# Patient Record
Sex: Male | Born: 1965 | Race: Black or African American | Hispanic: No | Marital: Married | Smoking: Never smoker
Health system: Southern US, Community
[De-identification: ages and names within clinical notes are randomized; demographics above are authoritative.]

## PROBLEM LIST (undated history)

## (undated) DIAGNOSIS — I10 Essential (primary) hypertension: Secondary | ICD-10-CM

## (undated) DIAGNOSIS — E785 Hyperlipidemia, unspecified: Secondary | ICD-10-CM

## (undated) DIAGNOSIS — N200 Calculus of kidney: Secondary | ICD-10-CM

## (undated) HISTORY — DX: Essential (primary) hypertension: I10

## (undated) HISTORY — PX: LITHOTRIPSY: SUR834

## (undated) HISTORY — DX: Hyperlipidemia, unspecified: E78.5

## (undated) HISTORY — DX: Calculus of kidney: N20.0

---

## 2003-07-08 ENCOUNTER — Ambulatory Visit (HOSPITAL_BASED_OUTPATIENT_CLINIC_OR_DEPARTMENT_OTHER): Admission: RE | Admit: 2003-07-08 | Discharge: 2003-07-08 | Payer: Self-pay

## 2008-05-03 ENCOUNTER — Emergency Department (HOSPITAL_COMMUNITY): Admission: EM | Admit: 2008-05-03 | Discharge: 2008-05-03 | Payer: Self-pay | Admitting: Emergency Medicine

## 2009-08-11 ENCOUNTER — Ambulatory Visit (HOSPITAL_COMMUNITY): Admission: RE | Admit: 2009-08-11 | Discharge: 2009-08-11 | Payer: Self-pay | Admitting: Family Medicine

## 2009-08-19 ENCOUNTER — Emergency Department (HOSPITAL_COMMUNITY): Admission: EM | Admit: 2009-08-19 | Discharge: 2009-08-20 | Payer: Self-pay | Admitting: Emergency Medicine

## 2010-04-21 LAB — URINALYSIS, ROUTINE W REFLEX MICROSCOPIC
Hgb urine dipstick: NEGATIVE
Protein, ur: NEGATIVE mg/dL
Specific Gravity, Urine: 1.025 (ref 1.005–1.030)
Urobilinogen, UA: 0.2 mg/dL (ref 0.0–1.0)

## 2010-04-21 LAB — DIFFERENTIAL
Eosinophils Absolute: 0.2 10*3/uL (ref 0.0–0.7)
Eosinophils Relative: 3 % (ref 0–5)
Lymphocytes Relative: 39 % (ref 12–46)
Lymphs Abs: 2.9 10*3/uL (ref 0.7–4.0)
Monocytes Absolute: 0.6 10*3/uL (ref 0.1–1.0)
Monocytes Relative: 8 % (ref 3–12)
Neutrophils Relative %: 50 % (ref 43–77)

## 2010-04-21 LAB — CBC
MCHC: 33.8 g/dL (ref 30.0–36.0)
MCV: 91.1 fL (ref 78.0–100.0)
Platelets: 284 10*3/uL (ref 150–400)

## 2010-04-21 LAB — COMPREHENSIVE METABOLIC PANEL
ALT: 27 U/L (ref 0–53)
AST: 20 U/L (ref 0–37)
Glucose, Bld: 116 mg/dL — ABNORMAL HIGH (ref 70–99)
Potassium: 3.7 mEq/L (ref 3.5–5.1)
Sodium: 137 mEq/L (ref 135–145)
Total Bilirubin: 0.5 mg/dL (ref 0.3–1.2)

## 2010-05-17 LAB — COMPREHENSIVE METABOLIC PANEL
ALT: 28 U/L (ref 0–53)
AST: 22 U/L (ref 0–37)
BUN: 16 mg/dL (ref 6–23)
CO2: 26 mEq/L (ref 19–32)
Creatinine, Ser: 1.31 mg/dL (ref 0.4–1.5)
GFR calc Af Amer: >60 mL/min (ref >60)
Glucose, Bld: 115 mg/dL — ABNORMAL HIGH (ref 70–99)
Potassium: 4.1 mEq/L (ref 3.5–5.1)

## 2010-05-17 LAB — CBC
HCT: 40 % (ref 39.0–52.0)
Hemoglobin: 13.7 g/dL (ref 13.0–17.0)
MCHC: 34.2 g/dL (ref 30.0–36.0)
MCV: 91.6 fL (ref 78.0–100.0)
Platelets: 313 10*3/uL (ref 150–400)
RBC: 4.37 MIL/uL (ref 4.22–5.81)
WBC: 7.7 10*3/uL (ref 4.0–10.5)

## 2010-05-17 LAB — DIFFERENTIAL
Eosinophils Absolute: 0 10*3/uL (ref 0.0–0.7)
Eosinophils Relative: 0 % (ref 0–5)
Lymphocytes Relative: 21 % (ref 12–46)
Lymphs Abs: 1.6 10*3/uL (ref 0.7–4.0)

## 2010-05-17 LAB — URINALYSIS, ROUTINE W REFLEX MICROSCOPIC
Leukocytes, UA: NEGATIVE
Nitrite: NEGATIVE
Urobilinogen, UA: 0.2 mg/dL (ref 0.0–1.0)

## 2010-06-22 NOTE — Op Note (Signed)
NAME:  Dave Barker, Dave Barker                    ACCOUNT NO.:  [id number]   MEDICAL RECORD NO.:  [id number]                   PATIENT TYPE:  AMB   LOCATION:  DSC                                  FACILITY:  [facility]   PHYSICIAN:  Pasty Spillers. Maple Hudson, M.D.              DATE OF BIRTH:  May 31, 1965   DATE OF PROCEDURE:  07/08/2003  DATE OF DISCHARGE:                                 OPERATIVE REPORT   PREOPERATIVE DIAGNOSIS:  Exotropia.   POSTOPERATIVE DIAGNOSIS:  Exotropia.   PROCEDURE:  1. Lateral rectus muscle recession, 8.0 mm left eye.  2. Lateral rectus muscle recession, 9.0 mm right eye.  3. Medial rectus muscle resection, 7.5 mm right eye.   SURGEON:  Pasty Spillers. Maple Hudson, M.D.   ANESTHESIA:  General (laryngeal mask).   COMPLICATIONS:  None.   DESCRIPTION OF PROCEDURE:  After routine preop evaluation including informed  consent, the patient was taken to the operating room where he was identified  by myself.  General anesthesia was induced without difficulty after  placement of appropriate monitors.  The patient was prepped and draped in  standard, sterile fashion. A lid speculum was placed in the left eye.   Through an inferotemporal fornix incision through conjunctiva and Tenon's  fascia, the left medial rectus muscle was engaged on a series of muscle  hooks and carefully cleared of its fascial attachments.  The tendon was  secured with a double-arm 6-0 Vicryl suture, with a double locking bite at  each border of the muscle, 1 mm from the insertion.  Muscle was disinserted,  and was reattached to sclera at a measured distance of 8.0 mm posterior to  the original insertion, using direct and scleral passes in crossed-swords  fashion. The suture ends were tied securely. After the position of the  muscle had been checked and found to be accurate, conjunctiva was closed  with 2 interrupted 6-0 plain gut sutures.   The lid speculum was transferred to the right eye where the lateral  rectus  muscle was recessed 9.0 mm, using the same technique described for the left  lateral rectus muscle recession.  Through an inferonasal fornix incision,  the left medial rectus muscle was retracted on a series of muscle hooks and  carefully cleared of its fascial attachments.  The muscle was spread between  2 self-retaining hooks.  A 2 mm bite was taken of the center of the muscle  belly with  double-arm 6-0 Vicryl suture, and a knot was tied securely at  this location.  The needle at each arm of the double arm suture was passed  from the center of the muscle belly to the periphery, 7.5 mm posterior to  and parallel to the insertion.  A double locking bite was placed at each  border of the muscle.  A resection clamp was placed on the muscle just  anterior to these sutures.  The muscle was disinserted, and each pole suture  was passed posteriorly to anteriorly through the periphery of the stump,  then anteriorly to posteriorly near the center of the stump, then  posteriorly to anteriorly through the center of the muscle belly, just  posterior to the previously placed knots.  The muscle was drawn up to the  level of the original insertion and all slack was removed before the suture  ends were tied  securely.  The conjunctiva was closed with 2 interrupted 6-0 plain gut  sutures. TobraDex ointment was placed in each eye. The patient was awakened  without difficulty and taken to the recovery room in stable condition,  having suffered no intraoperative or immediate postoperative complications.                                               Pasty Spillers. Maple Hudson, M.D.    Cheron Schaumann  D:  07/08/2003  T:  07/08/2003  Job:  [id number]

## 2012-08-26 ENCOUNTER — Encounter (HOSPITAL_COMMUNITY): Payer: Self-pay

## 2012-08-26 ENCOUNTER — Emergency Department (HOSPITAL_COMMUNITY)
Admission: EM | Admit: 2012-08-26 | Discharge: 2012-08-26 | Disposition: A | Discharge status: Home / Self Care | Payer: BC Managed Care – PPO | Attending: Emergency Medicine | Admitting: Emergency Medicine

## 2012-08-26 DIAGNOSIS — M79609 Pain in unspecified limb: Secondary | ICD-10-CM | POA: Insufficient documentation

## 2012-08-26 DIAGNOSIS — M79602 Pain in left arm: Secondary | ICD-10-CM

## 2012-08-26 MED ORDER — IBUPROFEN 800 MG PO TABS
800.0000 mg | ORAL_TABLET | Freq: Once | ORAL | Status: AC
  Administered 2012-08-26: 800 mg via ORAL
  Filled 2012-08-26: qty 1

## 2012-08-26 NOTE — ED Provider Notes (Signed)
   History    CSN: [id number] Arrival date & time 08/26/12  0215  First MD Initiated Contact with Patient 08/26/12 0253     Chief Complaint  Patient presents with  . Arm Pain   (Consider location/radiation/quality/duration/timing/severity/associated sxs/prior Treatment) HPI HPI Comments: Dave Barker is a 47 y.o. male who presents to the Emergency Department complaining of left arm pain since he was moving old tiles in a garage. He thought he might have been bitten by a spider. His arm hurts from the elbow to the wrist along the medial side.  He has taken no medicines.  PCP Dr. Phillips Odor History reviewed. No pertinent past medical history. History reviewed. No pertinent past surgical history. No family history on file. History  Substance Use Topics  . Smoking status: Never Smoker   . Smokeless tobacco: Not on file  . Alcohol Use: No    Review of Systems  Constitutional: Negative for fever.       10 Systems reviewed and are negative for acute change except as noted in the HPI.  HENT: Negative for congestion.   Eyes: Negative for discharge and redness.  Respiratory: Negative for cough and shortness of breath.   Cardiovascular: Negative for chest pain.  Gastrointestinal: Negative for vomiting and abdominal pain.  Musculoskeletal: Negative for back pain.       Left arm pain  Skin: Negative for rash.  Neurological: Negative for syncope, numbness and headaches.  Psychiatric/Behavioral:       No behavior change.    Allergies  Review of patient's allergies indicates no known allergies.  Home Medications  No current outpatient prescriptions on file. BP 141/81  Pulse 87  Temp(Src) 98.2 F (36.8 C) (Oral)  Resp 20  Ht 6' (1.829 m)  Wt 228 lb (103.42 kg)  BMI 30.92 kg/m2  SpO2 98% Physical Exam  Nursing note and vitals reviewed. Constitutional: He appears well-developed and well-nourished.  Awake, alert, nontoxic appearance.  HENT:  Head: Normocephalic and  atraumatic.  Eyes: EOM are normal. Pupils are equal, round, and reactive to light.  Neck: Normal range of motion. Neck supple.  Cardiovascular: Normal rate and intact distal pulses.   Pulmonary/Chest: Effort normal and breath sounds normal. He exhibits no tenderness.  Abdominal: Soft. There is no tenderness. There is no rebound.  Musculoskeletal: He exhibits no tenderness.  Baseline ROM, no obvious new focal weakness.Left arm without erythema, lesions, bruising. MiId tenderness to palpation over the medial side of his left arm.   Neurological:  Mental status and motor strength appears baseline for patient and situation.  Skin: No rash noted.  Psychiatric: He has a normal mood and affect.    ED Course  Procedures (including critical care time)  1. Arm pain, anterior, left     MDM  Patient with left arm pain, no lesions, bruising, erythema. Given ibuprofen and told to watch the arm for lesions and signs of infection. Pt stable in ED with no significant deterioration in condition.The patient appears reasonably screened and/or stabilized for discharge and I doubt any other medical condition or other [facility] requiring further screening, evaluation, or treatment in the ED at this time prior to discharge.  MDM Reviewed: nursing note and vitals     Nicoletta Dress. Colon Branch, MD 08/26/12 [phone]

## 2014-01-24 ENCOUNTER — Encounter (HOSPITAL_COMMUNITY): Payer: Self-pay

## 2014-01-24 ENCOUNTER — Ambulatory Visit (HOSPITAL_COMMUNITY)
Admission: RE | Admit: 2014-01-24 | Discharge: 2014-01-24 | Disposition: A | Discharge status: Home / Self Care | Payer: BC Managed Care – PPO | Source: Ambulatory Visit | Attending: Family Medicine | Admitting: Family Medicine

## 2014-01-24 ENCOUNTER — Other Ambulatory Visit (HOSPITAL_COMMUNITY): Payer: Self-pay | Admitting: Family Medicine

## 2014-01-24 DIAGNOSIS — R300 Dysuria: Secondary | ICD-10-CM | POA: Insufficient documentation

## 2014-01-24 DIAGNOSIS — K429 Umbilical hernia without obstruction or gangrene: Secondary | ICD-10-CM | POA: Insufficient documentation

## 2014-01-24 DIAGNOSIS — R310 Gross hematuria: Secondary | ICD-10-CM | POA: Insufficient documentation

## 2014-01-24 DIAGNOSIS — N200 Calculus of kidney: Secondary | ICD-10-CM | POA: Insufficient documentation

## 2014-01-24 MED ORDER — IOHEXOL 300 MG/ML  SOLN
100.0000 mL | Freq: Once | INTRAMUSCULAR | Status: AC | PRN
  Administered 2014-01-24: 100 mL via INTRAVENOUS

## 2018-11-17 ENCOUNTER — Emergency Department (HOSPITAL_COMMUNITY)
Admission: EM | Admit: 2018-11-17 | Discharge: 2018-11-17 | Disposition: A | Discharge status: Home / Self Care | Payer: BC Managed Care – PPO | Attending: Emergency Medicine | Admitting: Emergency Medicine

## 2018-11-17 ENCOUNTER — Other Ambulatory Visit: Payer: Self-pay

## 2018-11-17 DIAGNOSIS — R109 Unspecified abdominal pain: Secondary | ICD-10-CM | POA: Insufficient documentation

## 2018-11-17 DIAGNOSIS — R319 Hematuria, unspecified: Secondary | ICD-10-CM | POA: Insufficient documentation

## 2018-11-17 LAB — URINALYSIS, ROUTINE W REFLEX MICROSCOPIC
Bilirubin Urine: NEGATIVE
Glucose, UA: NEGATIVE mg/dL
Ketones, ur: NEGATIVE mg/dL
Leukocytes,Ua: NEGATIVE
Nitrite: NEGATIVE
Protein, ur: 30 mg/dL — AB
Specific Gravity, Urine: 1.018 (ref 1.005–1.030)
pH: 6 (ref 5.0–8.0)

## 2018-11-17 NOTE — ED Notes (Signed)
Patient states his flank pain has subsided.

## 2018-11-17 NOTE — ED Provider Notes (Signed)
[facility] EMERGENCY DEPARTMENT Provider Note   CSN: [id number] Arrival date & time: 11/17/18  1229     History   Chief Complaint Chief Complaint  Patient presents with  . Flank Pain    HPI Dave Barker is a 53 y.o. male.     HPI   Patient is a 53 year old male with a history of nephrolithiasis who presents the emergency department today complaining of right flank pain.  States it started while working on his car.  Pain started suddenly.  Rates pain radiated to the right side of the abdomen.  Pain was severe in nature and felt similar to prior kidney stones.  Since he arrived to the ED he states his pain is completely resolved.  He denies any associated nausea, vomiting, diarrhea, constipation or fevers.  He has had some dysuria and urinary frequency recently.  No past medical history on file.  There are no active problems to display for this patient.   No past surgical history on file.      Home Medications    Prior to Admission medications   Not on File    Family History No family history on file.  Social History Social History   Tobacco Use  . Smoking status: Never Smoker  Substance Use Topics  . Alcohol use: No  . Drug use: No     Allergies   Patient has no known allergies.   Review of Systems Review of Systems  Constitutional: Negative for fever.  HENT: Negative for ear pain and sore throat.   Eyes: Negative for pain and visual disturbance.  Respiratory: Negative for cough and shortness of breath.   Cardiovascular: Negative for chest pain.  Gastrointestinal: Positive for abdominal pain. Negative for constipation, diarrhea, nausea and vomiting.  Genitourinary: Positive for dysuria, flank pain and frequency. Negative for hematuria.  Musculoskeletal: Negative for myalgias.  Skin: Negative for rash.  Neurological: Negative for headaches.  All other systems reviewed and are negative.    Physical Exam Updated Vital Signs BP (!) 154/100  (BP Location: Left Arm)   Pulse 67   Temp 98.5 F (36.9 C) (Oral)   Resp 19   Ht 6' (1.829 m)   Wt 130.2 kg   SpO2 95%   BMI 38.92 kg/m   Physical Exam Vitals signs and nursing note reviewed.  Constitutional:      Appearance: He is well-developed.  HENT:     Head: Normocephalic and atraumatic.  Eyes:     Conjunctiva/sclera: Conjunctivae normal.  Neck:     Musculoskeletal: Neck supple.  Cardiovascular:     Rate and Rhythm: Normal rate and regular rhythm.     Pulses: Normal pulses.     Heart sounds: Normal heart sounds. No murmur.  Pulmonary:     Effort: Pulmonary effort is normal. No respiratory distress.     Breath sounds: Normal breath sounds. No wheezing, rhonchi or rales.  Abdominal:     General: Bowel sounds are normal.     Palpations: Abdomen is soft.     Tenderness: There is no abdominal tenderness. There is right CVA tenderness (mild). There is no left CVA tenderness, guarding or rebound.  Skin:    General: Skin is warm and dry.  Neurological:     Mental Status: He is alert.     ED Treatments / Results  Labs (all labs ordered are listed, but only abnormal results are displayed) Labs Reviewed  URINALYSIS, ROUTINE W REFLEX MICROSCOPIC - Abnormal; Notable for the  following components:      Result Value   APPearance HAZY (*)    Hgb urine dipstick MODERATE (*)    Protein, ur 30 (*)    Bacteria, UA RARE (*)    All other components within normal limits  URINE CULTURE    EKG None  Radiology No results found.  Procedures Procedures (including critical care time)  Medications Ordered in ED Medications - No data to display   Initial Impression / Assessment and Plan / ED Course  I have reviewed the triage vital signs and the nursing notes.  Pertinent labs & imaging results that were available during my care of the patient were reviewed by me and considered in my medical decision making (see chart for details).      Final Clinical Impressions(s) /  ED Diagnoses   Final diagnoses:  Right flank pain   Patient is a 53 year old male with a history of nephrolithiasis who presents the emergency department today complaining of right flank pain.  States it started while working on his car.  Pain started suddenly.  Rates pain radiated to the right side of the abdomen.  Pain was severe in nature and felt similar to prior kidney stones.  Since he arrived to the ED he states his pain is completely resolved.  He denies any associated nausea, vomiting, diarrhea, constipation or fevers.  He has had some dysuria and urinary frequency recently.  Will obtain UA.  Had long discussion with patient about obtaining laboratory work and CT scan, offered this work-up to him however he states since his pain has improved he would like to just check a UA and follow-up with urology if continues to have pain.  I feel that this is reasonable given that does not have any significant symptoms at this time and is well appearing on exam.  UA shows hematuria but no evidence of UTI.  Urine culture was added.  On reassessment patient continues to deny any flank or abdominal pain.  He has had no nausea or vomiting while in the ED.  He is well-appearing.  I suspect that he may have already passed a stone.  I discussed that he can follow-up with urology if he has persistent symptoms however he has any concerning symptoms and he should return to the emergency department immediately.  He voiced understanding of the plan and reasons to return.  All questions answered.  Patient stable for discharge.  ED Discharge Orders    None       Rodney Booze, [place] 11/17/18 1502    Margette Fast, MD 11/18/18 [phone]

## 2018-11-17 NOTE — ED Triage Notes (Signed)
Patient complain of right flank pain that started this morning. States history of kidney stones. Also states that he was working on his care when pain hit suddenly.

## 2018-11-17 NOTE — Discharge Instructions (Addendum)
You were given information to follow up with urology due to the concern for possible recurrent kidney stone. Please call the office to make an appointment as needed.   You will need to return to the emergency department for any fevers, persistent pain, persistent vomiting, inability to urinate, or any new or worsening symptoms.

## 2018-11-19 LAB — URINE CULTURE: Culture: NO GROWTH

## 2020-06-21 ENCOUNTER — Other Ambulatory Visit: Payer: Self-pay

## 2020-06-21 ENCOUNTER — Ambulatory Visit (HOSPITAL_COMMUNITY)
Admission: RE | Admit: 2020-06-21 | Discharge: 2020-06-21 | Disposition: A | Discharge status: Home / Self Care | Payer: BC Managed Care – PPO | Source: Ambulatory Visit | Attending: Family Medicine | Admitting: Family Medicine

## 2020-06-21 ENCOUNTER — Other Ambulatory Visit: Payer: Self-pay | Admitting: Family Medicine

## 2020-06-21 ENCOUNTER — Encounter (INDEPENDENT_AMBULATORY_CARE_PROVIDER_SITE_OTHER): Payer: Self-pay | Admitting: *Deleted

## 2020-06-21 ENCOUNTER — Other Ambulatory Visit (HOSPITAL_COMMUNITY): Payer: Self-pay | Admitting: Family Medicine

## 2020-06-21 DIAGNOSIS — N200 Calculus of kidney: Secondary | ICD-10-CM | POA: Diagnosis present

## 2020-06-21 DIAGNOSIS — M545 Low back pain, unspecified: Secondary | ICD-10-CM

## 2020-07-28 ENCOUNTER — Encounter: Payer: Self-pay | Admitting: Urology

## 2020-07-28 ENCOUNTER — Ambulatory Visit: Payer: BC Managed Care – PPO | Admitting: Urology

## 2020-07-28 ENCOUNTER — Other Ambulatory Visit: Payer: Self-pay

## 2020-07-28 VITALS — BP 143/90 | HR 87 | Ht 72.0 in | Wt 284.4 lb

## 2020-07-28 DIAGNOSIS — N200 Calculus of kidney: Secondary | ICD-10-CM | POA: Diagnosis not present

## 2020-07-28 LAB — URINALYSIS, ROUTINE W REFLEX MICROSCOPIC
Bilirubin, UA: NEGATIVE
Glucose, UA: NEGATIVE
Ketones, UA: NEGATIVE
Leukocytes,UA: NEGATIVE
Nitrite, UA: NEGATIVE
Protein,UA: NEGATIVE
Specific Gravity, UA: 1.025 (ref 1.005–1.030)
Urobilinogen, Ur: 1 mg/dL (ref 0.2–1.0)
pH, UA: 6 (ref 5.0–7.5)

## 2020-07-28 LAB — MICROSCOPIC EXAMINATION
Bacteria, UA: NONE SEEN
Renal Epithel, UA: NONE SEEN /hpf
WBC, UA: NONE SEEN /hpf (ref 0–5)

## 2020-07-28 NOTE — Progress Notes (Signed)
Urological Symptom Review  Patient is experiencing the following symptoms: Frequent urination Hard to postpone urination Get up at night to urinate Blood in urine   Review of Systems  Gastrointestinal (upper)  : Negative for upper GI symptoms  Gastrointestinal (lower) : Negative for lower GI symptoms  Constitutional : Negative for symptoms  Skin: Negative for skin symptoms  Eyes: Negative for eye symptoms  Ear/Nose/Throat : Negative for Ear/Nose/Throat symptoms  Hematologic/Lymphatic: Negative for Hematologic/Lymphatic symptoms  Cardiovascular : Negative for cardiovascular symptoms  Respiratory : Negative for respiratory symptoms  Endocrine: Negative for endocrine symptoms  Musculoskeletal: Negative for musculoskeletal symptoms  Neurological: Negative for neurological symptoms  Psychologic: Negative for psychiatric symptoms

## 2020-07-28 NOTE — Progress Notes (Signed)
07/28/2020 2:38 PM   Cathe Mons [id number] [id number]  Referring provider: Sharilyn Sites, MD [address] [place],  [place] [address]  Nephrolithiasis   HPI: Mr Yanko is a 55yo here for evaluation of nephrolithiasis. Starting 1 month ago he developed right flank pain and underwent renal US which showed bilateral 13-70mm calculi and no hydronephrosis. No flank pain currently. This is his 3rd stone event and last event treated with ESWL. NO LUTS   PMH: No past medical history on file.  Surgical History: No past surgical history on file.  Home Medications:  Allergies as of 07/28/2020   No Known Allergies      Medication List        Accurate as of July 28, 2020  2:38 PM. If you have any questions, ask your nurse or doctor.          STOP taking these medications    ketorolac 10 MG tablet Commonly known as: TORADOL Stopped by: Nicolette Bang, MD        Allergies: No Known Allergies  Family History: No family history on file.  Social History:  reports that he does not drink alcohol and does not use drugs. No history on file for tobacco use.  ROS: All other review of systems were reviewed and are negative except what is noted above in HPI  Physical Exam: BP (!) 143/90   Pulse 87   Ht 6' (1.829 m)   Wt 284 lb 6 oz (129 kg)   BMI 38.57 kg/m   Constitutional:  Alert and oriented, No acute distress. HEENT: [place] AT, moist mucus membranes.  Trachea midline, no masses. Cardiovascular: No clubbing, cyanosis, or edema. Respiratory: Normal respiratory effort, no increased work of breathing. GI: Abdomen is soft, nontender, nondistended, no abdominal masses GU: No CVA tenderness.  Lymph: No cervical or inguinal lymphadenopathy. Skin: No rashes, bruises or suspicious lesions. Neurologic: Grossly intact, no focal deficits, moving all 4 extremities. Psychiatric: Normal mood and affect.  Laboratory Data: Lab Results  Component Value Date   WBC 7.4  08/20/2009   HGB 11.9 (L) 08/20/2009   HCT 35.3 (L) 08/20/2009   MCV 91.1 08/20/2009   PLT 284 08/20/2009    Lab Results  Component Value Date   CREATININE 0.96 08/20/2009    No results found for: PSA  No results found for: TESTOSTERONE  No results found for: HGBA1C  Urinalysis    Component Value Date/Time   COLORURINE YELLOW 11/17/2018 1258   APPEARANCEUR HAZY (A) 11/17/2018 1258   LABSPEC 1.018 11/17/2018 1258   PHURINE 6.0 11/17/2018 1258   GLUCOSEU NEGATIVE 11/17/2018 1258   HGBUR MODERATE (A) 11/17/2018 1258   [place] 11/17/2018 [place] 11/17/2018 1258   PROTEINUR 30 (A) 11/17/2018 1258   UROBILINOGEN 0.2 08/20/2009 0001   NITRITE NEGATIVE 11/17/2018 1258   LEUKOCYTESUR NEGATIVE 11/17/2018 1258    Lab Results  Component Value Date   BACTERIA RARE (A) 11/17/2018    Pertinent Imaging: Renal US 06/21/2020: Images reviewed and discussed with the patient No results found for this or any previous visit.  No results found for this or any previous visit.  No results found for this or any previous visit.  No results found for this or any previous visit.  Results for orders placed during the hospital encounter of 06/21/20  US RENAL  Narrative CLINICAL DATA:  Right flank pain.  EXAM: RENAL / URINARY TRACT ULTRASOUND COMPLETE  COMPARISON:  January 24, 2014.  FINDINGS: Right Kidney:  Renal measurements: 11.9 x 6.8 x 6.2 cm = volume: 263 mL. Multiple echogenic foci are noted concerning for nephrolithiasis, the largest measuring 13 mm. Echogenicity within normal limits. No mass or hydronephrosis visualized.  Left Kidney:  Renal measurements: 10.4 x 5.5 x 5.4 cm = volume: 163 mL. 14 mm nonobstructive calculus seen in lower pole collecting system. Echogenicity within normal limits. No mass or hydronephrosis visualized.  Bladder:  Appears normal for degree of bladder  distention.  Other:  None.  IMPRESSION: Bilateral nonobstructive nephrolithiasis. No hydronephrosis or renal obstruction is noted.   Electronically Signed By: Marijo Conception M.D. On: 06/21/2020 12:28  No results found for this or any previous visit.  No results found for this or any previous visit.  No results found for this or any previous visit.   Assessment & Plan:    1. Kidney stones -STAT stone study - Urinalysis, Routine w reflex microscopic - CT RENAL STONE STUDY   No follow-ups on file.  Nicolette Bang, MD  [facility] [place]

## 2020-07-28 NOTE — Patient Instructions (Signed)
Textbook of Natural Medicine (5th ed., pp. 1518-1527.e3). [place], [place]: Elsevier.">  Dietary Guidelines to Help Prevent Kidney Stones Kidney stones are deposits of minerals and salts that form inside your kidneys. Your risk of developing kidney stones may be greater depending on your diet, your lifestyle, the medicines you take, and whether you have certain medical conditions. Most people can lower their chances of developing kidney stones by following the instructions below. Your dietitian may give you more specific instructions depending on your overall health and the type of kidney stones youtend to develop. What are tips for following this plan? Reading food labels  Choose foods with "no salt added" or "low-salt" labels. Limit your salt (sodium) intake to less than 1,500 mg a day. Choose foods with calcium for each meal and snack. Try to eat about 300 mg of calcium at each meal. Foods that contain 200-500 mg of calcium a serving include: 8 oz (237 mL) of milk, calcium-fortifiednon-dairy milk, and calcium-fortifiedfruit juice. Calcium-fortified means that calcium has been added to these drinks. 8 oz (237 mL) of kefir, yogurt, and soy yogurt. 4 oz (114 g) of tofu. 1 oz (28 g) of cheese. 1 cup (150 g) of dried figs. 1 cup (91 g) of cooked broccoli. One 3 oz (85 g) can of sardines or mackerel. Most people need 1,000-1,500 mg of calcium a day. Talk to your dietitian abouthow much calcium is recommended for you. Shopping Buy plenty of fresh fruits and vegetables. Most people do not need to avoid fruits and vegetables, even if these foods contain nutrients that may contribute to kidney stones. When shopping for convenience foods, choose: Whole pieces of fruit. Pre-made salads with dressing on the side. Low-fat fruit and yogurt smoothies. Avoid buying frozen meals or prepared deli foods. These can be high in sodium. Look for foods with live cultures, such as yogurt and kefir. Choose high-fiber  grains, such as whole-wheat breads, oat bran, and wheat cereals. Cooking Do not add salt to food when cooking. Place a salt shaker on the table and allow each person to add his or her own salt to taste. Use vegetable protein, such as beans, textured vegetable protein (TVP), or tofu, instead of meat in pasta, casseroles, and soups. Meal planning Eat less salt, if told by your dietitian. To do this: Avoid eating processed or pre-made food. Avoid eating fast food. Eat less animal protein, including cheese, meat, poultry, or fish, if told by your dietitian. To do this: Limit the number of times you have meat, poultry, fish, or cheese each week. Eat a diet free of meat at least 2 days a week. Eat only one serving each day of meat, poultry, fish, or seafood. When you prepare animal protein, cut pieces into small portion sizes. For most meat and fish, one serving is about the size of the palm of your hand. Eat at least five servings of fresh fruits and vegetables each day. To do this: Keep fruits and vegetables on hand for snacks. Eat one piece of fruit or a handful of berries with breakfast. Have a salad and fruit at lunch. Have two kinds of vegetables at dinner. Limit foods that are high in a substance called oxalate. These include: Spinach (cooked), rhubarb, beets, sweet potatoes, and Swiss chard. Peanuts. Potato chips, french fries, and baked potatoes with skin on. Nuts and nut products. Chocolate. If you regularly take a diuretic medicine, make sure to eat at least 1 or 2 servings of fruits or vegetables that are   high in potassium each day. These include: Avocado. Banana. Orange, prune, carrot, or tomato juice. Baked potato. Cabbage. Beans and split peas. Lifestyle  Drink enough fluid to keep your urine pale yellow. This is the most important thing you can do. Spread your fluid intake throughout the day. If you drink alcohol: Limit how much you use to: 0-1 drink a day for women who  are not pregnant. 0-2 drinks a day for men. Be aware of how much alcohol is in your drink. In the [place] one drink equals one 12 oz bottle of beer (355 mL), one 5 oz glass of wine (148 mL), or one 1 oz glass of hard liquor (44 mL). Lose weight if told by your health care provider. Work with your dietitian to find an eating plan and weight loss strategies that work best for you.  General information Talk to your health care provider and dietitian about taking daily supplements. You may be told the following depending on your health and the cause of your kidney stones: Not to take supplements with vitamin C. To take a calcium supplement. To take a daily probiotic supplement. To take other supplements such as magnesium, fish oil, or vitamin B6. Take over-the-counter and prescription medicines only as told by your health care provider. These include supplements. What foods should I limit? Limit your intake of the following foods, or eat them as told by your dietitian. Vegetables Spinach. Rhubarb. Beets. Canned vegetables. Pickles. Olives. Baked potatoeswith skin. Grains Wheat bran. Baked goods. Salted crackers. Cereals high in sugar. Meats and other proteins Nuts. Nut butters. Large portions of meat, poultry, or fish. Salted, precooked,or cured meats, such as sausages, meat loaves, and hot dogs. Dairy Cheese. Beverages Regular soft drinks. Regular vegetable juice. Seasonings and condiments Seasoning blends with salt. Salad dressings. Soy sauce. Ketchup. Barbecue sauce. Other foods Canned soups. Canned pasta sauce. Casseroles. Pizza. Lasagna. Frozen meals.Potato chips. French fries. The items listed above may not be a complete list of foods and beverages you should limit. Contact a dietitian for more information. What foods should I avoid? Talk to your dietitian about specific foods you should avoid based on the typeof kidney stones you have and your overall health. Fruits Grapefruit. The  item listed above may not be a complete list of foods and beverages you should avoid. Contact a dietitian for more information. Summary Kidney stones are deposits of minerals and salts that form inside your kidneys. You can lower your risk of kidney stones by making changes to your diet. The most important thing you can do is drink enough fluid. Drink enough fluid to keep your urine pale yellow. Talk to your dietitian about how much calcium you should have each day, and eat less salt and animal protein as told by your dietitian. This information is not intended to replace advice given to you by your health care provider. Make sure you discuss any questions you have with your healthcare provider. Document Revised: 01/14/2019 Document Reviewed: 01/14/2019 Elsevier Patient Education  2022 Elsevier Inc.  

## 2020-07-31 ENCOUNTER — Ambulatory Visit (HOSPITAL_COMMUNITY)
Admission: RE | Admit: 2020-07-31 | Discharge: 2020-07-31 | Disposition: A | Discharge status: Home / Self Care | Payer: BC Managed Care – PPO | Source: Ambulatory Visit | Attending: Urology | Admitting: Urology

## 2020-07-31 ENCOUNTER — Other Ambulatory Visit: Payer: Self-pay

## 2020-07-31 DIAGNOSIS — N200 Calculus of kidney: Secondary | ICD-10-CM | POA: Insufficient documentation

## 2020-08-04 ENCOUNTER — Other Ambulatory Visit: Payer: Self-pay

## 2020-08-04 ENCOUNTER — Ambulatory Visit: Payer: BC Managed Care – PPO | Admitting: Urology

## 2020-08-04 VITALS — BP 127/76 | HR 60

## 2020-08-04 DIAGNOSIS — N200 Calculus of kidney: Secondary | ICD-10-CM

## 2020-08-04 NOTE — Progress Notes (Signed)
08/04/2020 2:30 PM   Dave Barker 25-Jan-1966 [id number]  Referring provider: Sharilyn Sites, MD [address] [place],  [place] [address]  nephrolithiasis   HPI: [name] is a 55yo here for followuop for nephrolithiasis. CT stone study shows a 1cm left renal calculus and no right renal calculus. He denies any left flank pain. He denies any LUTS. No hematuria or dysuria. No other complaints today   PMH: No past medical history on file.  Surgical History: No past surgical history on file.  Home Medications:  Allergies as of 08/04/2020   No Known Allergies      Medication List    as of August 04, 2020  2:30 PM   You have not been prescribed any medications.     Allergies: No Known Allergies  Family History: No family history on file.  Social History:  reports that he does not drink alcohol and does not use drugs. No history on file for tobacco use.  ROS: All other review of systems were reviewed and are negative except what is noted above in HPI  Physical Exam: BP 127/76   Pulse 60   Constitutional:  Alert and oriented, No acute distress. HEENT: [place] AT, moist mucus membranes.  Trachea midline, no masses. Cardiovascular: No clubbing, cyanosis, or edema. Respiratory: Normal respiratory effort, no increased work of breathing. GI: Abdomen is soft, nontender, nondistended, no abdominal masses GU: No CVA tenderness.  Lymph: No cervical or inguinal lymphadenopathy. Skin: No rashes, bruises or suspicious lesions. Neurologic: Grossly intact, no focal deficits, moving all 4 extremities. Psychiatric: Normal mood and affect.  Laboratory Data: Lab Results  Component Value Date   WBC 7.4 08/20/2009   HGB 11.9 (L) 08/20/2009   HCT 35.3 (L) 08/20/2009   MCV 91.1 08/20/2009   PLT 284 08/20/2009    Lab Results  Component Value Date   CREATININE 0.96 08/20/2009    No results found for: PSA  No results found for: TESTOSTERONE  No results found for:  HGBA1C  Urinalysis    Component Value Date/Time   COLORURINE YELLOW 11/17/2018 1258   APPEARANCEUR Clear 07/28/2020 1404   LABSPEC 1.018 11/17/2018 1258   PHURINE 6.0 11/17/2018 1258   GLUCOSEU Negative 07/28/2020 1404   HGBUR MODERATE (A) 11/17/2018 1258   BILIRUBINUR Negative 07/28/2020 1404   KETONESUR NEGATIVE 11/17/2018 1258   PROTEINUR Negative 07/28/2020 1404   PROTEINUR 30 (A) 11/17/2018 1258   UROBILINOGEN 0.2 08/20/2009 0001   NITRITE Negative 07/28/2020 1404   NITRITE NEGATIVE 11/17/2018 1258   LEUKOCYTESUR Negative 07/28/2020 1404   LEUKOCYTESUR NEGATIVE 11/17/2018 1258    Lab Results  Component Value Date   LABMICR See below: 07/28/2020   WBCUA None seen 07/28/2020   LABEPIT 0-10 07/28/2020   MUCUS Present 07/28/2020   BACTERIA None seen 07/28/2020    Pertinent Imaging: CT stone study 07/31/2020: Images reviewed and discussed with the patient No results found for this or any previous visit.  No results found for this or any previous visit.  No results found for this or any previous visit.  No results found for this or any previous visit.  Results for orders placed during the hospital encounter of 06/21/20  US RENAL  Narrative CLINICAL DATA:  Right flank pain.  EXAM: RENAL / URINARY TRACT ULTRASOUND COMPLETE  COMPARISON:  January 24, 2014.  FINDINGS: Right Kidney:  Renal measurements: 11.9 x 6.8 x 6.2 cm = volume: 263 mL. Multiple echogenic foci are noted concerning for nephrolithiasis, the largest  measuring 13 mm. Echogenicity within normal limits. No mass or hydronephrosis visualized.  Left Kidney:  Renal measurements: 10.4 x 5.5 x 5.4 cm = volume: 163 mL. 14 mm nonobstructive calculus seen in lower pole collecting system. Echogenicity within normal limits. No mass or hydronephrosis visualized.  Bladder:  Appears normal for degree of bladder distention.  Other:  None.  IMPRESSION: Bilateral nonobstructive nephrolithiasis. No  hydronephrosis or renal obstruction is noted.   Electronically Signed By: Marijo Conception M.D. On: 06/21/2020 12:28  No results found for this or any previous visit.  No results found for this or any previous visit.  Results for orders placed in visit on 07/28/20  CT RENAL STONE STUDY  Narrative CLINICAL DATA:  Hematuria, right-sided flank pain x1 0.5 months. History of renal stone.  EXAM: CT ABDOMEN AND PELVIS WITHOUT CONTRAST  TECHNIQUE: Multidetector CT imaging of the abdomen and pelvis was performed following the standard protocol without IV contrast.  COMPARISON:  Ultrasound Jun 21, 2020 and CT January 24, 2014  FINDINGS: Lower chest: No acute abnormality. Right lower lobe calcified granuloma.  Hepatobiliary: Small calcification in the right lobe of the liver on image 18/2, favored sequela prior infection/inflammation. Otherwise unremarkable noncontrast appearance of the hepatic parenchyma. Gallbladder is unremarkable. No biliary ductal dilation.  Pancreas: Within normal limits.  Spleen: Within normal limits.  Adrenals/Urinary Tract: Bilateral adrenal glands are unremarkable.  No hydronephrosis. No right-sided renal stones. Nonobstructive left renal calculi measuring up to 1 cm. No ureteral or bladder calculi visualized. No contour deforming renal masses. Hypodense 5 mm right upper pole renal lesion, incompletely evaluated secondary to size and lack of intravenous contrast material but statistically most likely represent cysts.  Urinary bladder is grossly unremarkable for degree of distension.  Stomach/Bowel: Stomach is within normal limits. Appendix appears normal. No evidence of bowel wall thickening, distention, or inflammatory changes. The colon is predominately decompressed limiting evaluation.  Vascular/Lymphatic: No abdominal aortic aneurysm. No pathologically enlarged abdominal or pelvic lymph nodes.  Reproductive: Mild prostatic  enlargement.  Other: Small fat containing umbilical hernia. No abdominopelvic ascites.  Musculoskeletal: No acute or significant osseous findings.  IMPRESSION: 1. Nonobstructive left nephrolithiasis measuring up to 1 cm. 2. No hydronephrosis. No right-sided renal stones visualized. No ureteral or bladder calculi visualized. 3. Small fat containing umbilical hernia.   Electronically Signed By: Dahlia Bailiff MD On: 07/31/2020 10:54   Assessment & Plan:    1. Kidney stones -We discussed the management of kidney stones. These options include observation, ureteroscopy, shockwave lithotripsy (ESWL) and percutaneous nephrolithotomy (PCNL). We discussed which options are relevant to the patient's stone(s). We discussed the natural history of kidney stones as well as the complications of untreated stones and the impact on quality of life without treatment as well as with each of the above listed treatments. We also discussed the efficacy of each treatment in its ability to clear the stone burden. With any of these management options I discussed the signs and symptoms of infection and the need for emergent treatment should these be experienced. For each option we discussed the ability of each procedure to clear the patient of their stone burden.   For observation I described the risks which include but are not limited to silent renal damage, life-threatening infection, need for emergent surgery, failure to pass stone and pain.   For ureteroscopy I described the risks which include bleeding, infection, damage to contiguous structures, positioning injury, ureteral stricture, ureteral avulsion, ureteral injury, need for prolonged ureteral  stent, inability to perform ureteroscopy, need for an interval procedure, inability to clear stone burden, stent discomfort/pain, heart attack, stroke, pulmonary embolus and the inherent risks with general anesthesia.   For shockwave lithotripsy I described the  risks which include arrhythmia, kidney contusion, kidney hemorrhage, need for transfusion, pain, inability to adequately break up stone, inability to pass stone fragments, Steinstrasse, infection associated with obstructing stones, need for alternate surgical procedure, need for repeat shockwave lithotripsy, MI, CVA, PE and the inherent risks with anesthesia/conscious sedation.   For PCNL I described the risks including positioning injury, pneumothorax, hydrothorax, need for chest tube, inability to clear stone burden, renal laceration, arterial venous fistula or malformation, need for embolization of kidney, loss of kidney or renal function, need for repeat procedure, need for prolonged nephrostomy tube, ureteral avulsion, MI, CVA, PE and the inherent risks of general anesthesia.   - The patient would like to proceed with left ESWL  - Urinalysis, Routine w reflex microscopic   No follow-ups on file.  Nicolette Bang, MD  [facility] [place]

## 2020-08-04 NOTE — H&P (View-Only) (Signed)
08/04/2020 2:30 PM   Dave Barker 02-11-1965 [id number]  Referring provider: Sharilyn Sites, MD [address] [place],  [place] [address]  nephrolithiasis   HPI: Dave Barker is a 55yo here for followuop for nephrolithiasis. CT stone study shows a 1cm left renal calculus and no right renal calculus. He denies any left flank pain. He denies any LUTS. No hematuria or dysuria. No other complaints today   PMH: No past medical history on file.  Surgical History: No past surgical history on file.  Home Medications:  Allergies as of 08/04/2020   No Known Allergies      Medication List    as of August 04, 2020  2:30 PM   You have not been prescribed any medications.     Allergies: No Known Allergies  Family History: No family history on file.  Social History:  reports that he does not drink alcohol and does not use drugs. No history on file for tobacco use.  ROS: All other review of systems were reviewed and are negative except what is noted above in HPI  Physical Exam: BP 127/76   Pulse 60   Constitutional:  Alert and oriented, No acute distress. HEENT: [place] AT, moist mucus membranes.  Trachea midline, no masses. Cardiovascular: No clubbing, cyanosis, or edema. Respiratory: Normal respiratory effort, no increased work of breathing. GI: Abdomen is soft, nontender, nondistended, no abdominal masses GU: No CVA tenderness.  Lymph: No cervical or inguinal lymphadenopathy. Skin: No rashes, bruises or suspicious lesions. Neurologic: Grossly intact, no focal deficits, moving all 4 extremities. Psychiatric: Normal mood and affect.  Laboratory Data: Lab Results  Component Value Date   WBC 7.4 08/20/2009   HGB 11.9 (L) 08/20/2009   HCT 35.3 (L) 08/20/2009   MCV 91.1 08/20/2009   PLT 284 08/20/2009    Lab Results  Component Value Date   CREATININE 0.96 08/20/2009    No results found for: PSA  No results found for: TESTOSTERONE  No results found for:  HGBA1C  Urinalysis    Component Value Date/Time   COLORURINE YELLOW 11/17/2018 1258   APPEARANCEUR Clear 07/28/2020 1404   LABSPEC 1.018 11/17/2018 1258   PHURINE 6.0 11/17/2018 1258   GLUCOSEU Negative 07/28/2020 1404   HGBUR MODERATE (A) 11/17/2018 1258   BILIRUBINUR Negative 07/28/2020 1404   KETONESUR NEGATIVE 11/17/2018 1258   PROTEINUR Negative 07/28/2020 1404   PROTEINUR 30 (A) 11/17/2018 1258   UROBILINOGEN 0.2 08/20/2009 0001   NITRITE Negative 07/28/2020 1404   NITRITE NEGATIVE 11/17/2018 1258   LEUKOCYTESUR Negative 07/28/2020 1404   LEUKOCYTESUR NEGATIVE 11/17/2018 1258    Lab Results  Component Value Date   LABMICR See below: 07/28/2020   WBCUA None seen 07/28/2020   LABEPIT 0-10 07/28/2020   MUCUS Present 07/28/2020   BACTERIA None seen 07/28/2020    Pertinent Imaging: CT stone study 07/31/2020: Images reviewed and discussed with the patient No results found for this or any previous visit.  No results found for this or any previous visit.  No results found for this or any previous visit.  No results found for this or any previous visit.  Results for orders placed during the hospital encounter of 06/21/20  US RENAL  Narrative CLINICAL DATA:  Right flank pain.  EXAM: RENAL / URINARY TRACT ULTRASOUND COMPLETE  COMPARISON:  January 24, 2014.  FINDINGS: Right Kidney:  Renal measurements: 11.9 x 6.8 x 6.2 cm = volume: 263 mL. Multiple echogenic foci are noted concerning for nephrolithiasis, the largest  measuring 13 mm. Echogenicity within normal limits. No mass or hydronephrosis visualized.  Left Kidney:  Renal measurements: 10.4 x 5.5 x 5.4 cm = volume: 163 mL. 14 mm nonobstructive calculus seen in lower pole collecting system. Echogenicity within normal limits. No mass or hydronephrosis visualized.  Bladder:  Appears normal for degree of bladder distention.  Other:  None.  IMPRESSION: Bilateral nonobstructive nephrolithiasis. No  hydronephrosis or renal obstruction is noted.   Electronically Signed By: Marijo Conception M.D. On: 06/21/2020 12:28  No results found for this or any previous visit.  No results found for this or any previous visit.  Results for orders placed in visit on 07/28/20  CT RENAL STONE STUDY  Narrative CLINICAL DATA:  Hematuria, right-sided flank pain x1 0.5 months. History of renal stone.  EXAM: CT ABDOMEN AND PELVIS WITHOUT CONTRAST  TECHNIQUE: Multidetector CT imaging of the abdomen and pelvis was performed following the standard protocol without IV contrast.  COMPARISON:  Ultrasound Jun 21, 2020 and CT January 24, 2014  FINDINGS: Lower chest: No acute abnormality. Right lower lobe calcified granuloma.  Hepatobiliary: Small calcification in the right lobe of the liver on image 18/2, favored sequela prior infection/inflammation. Otherwise unremarkable noncontrast appearance of the hepatic parenchyma. Gallbladder is unremarkable. No biliary ductal dilation.  Pancreas: Within normal limits.  Spleen: Within normal limits.  Adrenals/Urinary Tract: Bilateral adrenal glands are unremarkable.  No hydronephrosis. No right-sided renal stones. Nonobstructive left renal calculi measuring up to 1 cm. No ureteral or bladder calculi visualized. No contour deforming renal masses. Hypodense 5 mm right upper pole renal lesion, incompletely evaluated secondary to size and lack of intravenous contrast material but statistically most likely represent cysts.  Urinary bladder is grossly unremarkable for degree of distension.  Stomach/Bowel: Stomach is within normal limits. Appendix appears normal. No evidence of bowel wall thickening, distention, or inflammatory changes. The colon is predominately decompressed limiting evaluation.  Vascular/Lymphatic: No abdominal aortic aneurysm. No pathologically enlarged abdominal or pelvic lymph nodes.  Reproductive: Mild prostatic  enlargement.  Other: Small fat containing umbilical hernia. No abdominopelvic ascites.  Musculoskeletal: No acute or significant osseous findings.  IMPRESSION: 1. Nonobstructive left nephrolithiasis measuring up to 1 cm. 2. No hydronephrosis. No right-sided renal stones visualized. No ureteral or bladder calculi visualized. 3. Small fat containing umbilical hernia.   Electronically Signed By: Dahlia Bailiff MD On: 07/31/2020 10:54   Assessment & Plan:    1. Kidney stones -We discussed the management of kidney stones. These options include observation, ureteroscopy, shockwave lithotripsy (ESWL) and percutaneous nephrolithotomy (PCNL). We discussed which options are relevant to the patient's stone(s). We discussed the natural history of kidney stones as well as the complications of untreated stones and the impact on quality of life without treatment as well as with each of the above listed treatments. We also discussed the efficacy of each treatment in its ability to clear the stone burden. With any of these management options I discussed the signs and symptoms of infection and the need for emergent treatment should these be experienced. For each option we discussed the ability of each procedure to clear the patient of their stone burden.   For observation I described the risks which include but are not limited to silent renal damage, life-threatening infection, need for emergent surgery, failure to pass stone and pain.   For ureteroscopy I described the risks which include bleeding, infection, damage to contiguous structures, positioning injury, ureteral stricture, ureteral avulsion, ureteral injury, need for prolonged ureteral  stent, inability to perform ureteroscopy, need for an interval procedure, inability to clear stone burden, stent discomfort/pain, heart attack, stroke, pulmonary embolus and the inherent risks with general anesthesia.   For shockwave lithotripsy I described the  risks which include arrhythmia, kidney contusion, kidney hemorrhage, need for transfusion, pain, inability to adequately break up stone, inability to pass stone fragments, Steinstrasse, infection associated with obstructing stones, need for alternate surgical procedure, need for repeat shockwave lithotripsy, MI, CVA, PE and the inherent risks with anesthesia/conscious sedation.   For PCNL I described the risks including positioning injury, pneumothorax, hydrothorax, need for chest tube, inability to clear stone burden, renal laceration, arterial venous fistula or malformation, need for embolization of kidney, loss of kidney or renal function, need for repeat procedure, need for prolonged nephrostomy tube, ureteral avulsion, MI, CVA, PE and the inherent risks of general anesthesia.   - The patient would like to proceed with left ESWL  - Urinalysis, Routine w reflex microscopic   No follow-ups on file.  Nicolette Bang, MD  [facility] [place]

## 2020-08-04 NOTE — Progress Notes (Signed)

## 2020-08-08 ENCOUNTER — Encounter: Payer: Self-pay | Admitting: Urology

## 2020-08-08 NOTE — Patient Instructions (Signed)
Textbook of Natural Medicine (5th ed., pp. 1518-1527.e3). [place], [place]: Elsevier.">  Dietary Guidelines to Help Prevent Kidney Stones Kidney stones are deposits of minerals and salts that form inside your kidneys. Your risk of developing kidney stones may be greater depending on your diet, your lifestyle, the medicines you take, and whether you have certain medical conditions. Most people can lower their chances of developing kidney stones by following the instructions below. Your dietitian may give you more specific instructions depending on your overall health and the type of kidney stones youtend to develop. What are tips for following this plan? Reading food labels  Choose foods with "no salt added" or "low-salt" labels. Limit your salt (sodium) intake to less than 1,500 mg a day. Choose foods with calcium for each meal and snack. Try to eat about 300 mg of calcium at each meal. Foods that contain 200-500 mg of calcium a serving include: 8 oz (237 mL) of milk, calcium-fortifiednon-dairy milk, and calcium-fortifiedfruit juice. Calcium-fortified means that calcium has been added to these drinks. 8 oz (237 mL) of kefir, yogurt, and soy yogurt. 4 oz (114 g) of tofu. 1 oz (28 g) of cheese. 1 cup (150 g) of dried figs. 1 cup (91 g) of cooked broccoli. One 3 oz (85 g) can of sardines or mackerel. Most people need 1,000-1,500 mg of calcium a day. Talk to your dietitian abouthow much calcium is recommended for you. Shopping Buy plenty of fresh fruits and vegetables. Most people do not need to avoid fruits and vegetables, even if these foods contain nutrients that may contribute to kidney stones. When shopping for convenience foods, choose: Whole pieces of fruit. Pre-made salads with dressing on the side. Low-fat fruit and yogurt smoothies. Avoid buying frozen meals or prepared deli foods. These can be high in sodium. Look for foods with live cultures, such as yogurt and kefir. Choose high-fiber  grains, such as whole-wheat breads, oat bran, and wheat cereals. Cooking Do not add salt to food when cooking. Place a salt shaker on the table and allow each person to add his or her own salt to taste. Use vegetable protein, such as beans, textured vegetable protein (TVP), or tofu, instead of meat in pasta, casseroles, and soups. Meal planning Eat less salt, if told by your dietitian. To do this: Avoid eating processed or pre-made food. Avoid eating fast food. Eat less animal protein, including cheese, meat, poultry, or fish, if told by your dietitian. To do this: Limit the number of times you have meat, poultry, fish, or cheese each week. Eat a diet free of meat at least 2 days a week. Eat only one serving each day of meat, poultry, fish, or seafood. When you prepare animal protein, cut pieces into small portion sizes. For most meat and fish, one serving is about the size of the palm of your hand. Eat at least five servings of fresh fruits and vegetables each day. To do this: Keep fruits and vegetables on hand for snacks. Eat one piece of fruit or a handful of berries with breakfast. Have a salad and fruit at lunch. Have two kinds of vegetables at dinner. Limit foods that are high in a substance called oxalate. These include: Spinach (cooked), rhubarb, beets, sweet potatoes, and Swiss chard. Peanuts. Potato chips, french fries, and baked potatoes with skin on. Nuts and nut products. Chocolate. If you regularly take a diuretic medicine, make sure to eat at least 1 or 2 servings of fruits or vegetables that are   high in potassium each day. These include: Avocado. Banana. Orange, prune, carrot, or tomato juice. Baked potato. Cabbage. Beans and split peas. Lifestyle  Drink enough fluid to keep your urine pale yellow. This is the most important thing you can do. Spread your fluid intake throughout the day. If you drink alcohol: Limit how much you use to: 0-1 drink a day for women who  are not pregnant. 0-2 drinks a day for men. Be aware of how much alcohol is in your drink. In the [place] one drink equals one 12 oz bottle of beer (355 mL), one 5 oz glass of wine (148 mL), or one 1 oz glass of hard liquor (44 mL). Lose weight if told by your health care provider. Work with your dietitian to find an eating plan and weight loss strategies that work best for you.  General information Talk to your health care provider and dietitian about taking daily supplements. You may be told the following depending on your health and the cause of your kidney stones: Not to take supplements with vitamin C. To take a calcium supplement. To take a daily probiotic supplement. To take other supplements such as magnesium, fish oil, or vitamin B6. Take over-the-counter and prescription medicines only as told by your health care provider. These include supplements. What foods should I limit? Limit your intake of the following foods, or eat them as told by your dietitian. Vegetables Spinach. Rhubarb. Beets. Canned vegetables. Pickles. Olives. Baked potatoeswith skin. Grains Wheat bran. Baked goods. Salted crackers. Cereals high in sugar. Meats and other proteins Nuts. Nut butters. Large portions of meat, poultry, or fish. Salted, precooked,or cured meats, such as sausages, meat loaves, and hot dogs. Dairy Cheese. Beverages Regular soft drinks. Regular vegetable juice. Seasonings and condiments Seasoning blends with salt. Salad dressings. Soy sauce. Ketchup. Barbecue sauce. Other foods Canned soups. Canned pasta sauce. Casseroles. Pizza. Lasagna. Frozen meals.Potato chips. French fries. The items listed above may not be a complete list of foods and beverages you should limit. Contact a dietitian for more information. What foods should I avoid? Talk to your dietitian about specific foods you should avoid based on the typeof kidney stones you have and your overall health. Fruits Grapefruit. The  item listed above may not be a complete list of foods and beverages you should avoid. Contact a dietitian for more information. Summary Kidney stones are deposits of minerals and salts that form inside your kidneys. You can lower your risk of kidney stones by making changes to your diet. The most important thing you can do is drink enough fluid. Drink enough fluid to keep your urine pale yellow. Talk to your dietitian about how much calcium you should have each day, and eat less salt and animal protein as told by your dietitian. This information is not intended to replace advice given to you by your health care provider. Make sure you discuss any questions you have with your healthcare provider. Document Revised: 01/14/2019 Document Reviewed: 01/14/2019 Elsevier Patient Education  2022 Elsevier Inc.  

## 2020-08-10 ENCOUNTER — Other Ambulatory Visit: Payer: Self-pay

## 2020-08-10 ENCOUNTER — Encounter (HOSPITAL_COMMUNITY)
Admission: RE | Admit: 2020-08-10 | Discharge: 2020-08-10 | Disposition: A | Discharge status: Home / Self Care | Payer: BC Managed Care – PPO | Source: Ambulatory Visit | Attending: Urology | Admitting: Urology

## 2020-08-15 ENCOUNTER — Encounter (HOSPITAL_COMMUNITY)
Admission: RE | Disposition: A | Discharge status: Home / Self Care | Payer: Self-pay | Source: Home / Self Care | Attending: Urology

## 2020-08-15 ENCOUNTER — Ambulatory Visit (HOSPITAL_COMMUNITY)
Admission: RE | Admit: 2020-08-15 | Discharge: 2020-08-15 | Disposition: A | Discharge status: Home / Self Care | Payer: BC Managed Care – PPO | Source: Ambulatory Visit | Attending: Urology | Admitting: Urology

## 2020-08-15 ENCOUNTER — Other Ambulatory Visit: Payer: Self-pay

## 2020-08-15 ENCOUNTER — Ambulatory Visit (HOSPITAL_COMMUNITY)
Admission: RE | Admit: 2020-08-15 | Discharge: 2020-08-15 | Disposition: A | Discharge status: Home / Self Care | Payer: BC Managed Care – PPO | Attending: Urology | Admitting: Urology

## 2020-08-15 ENCOUNTER — Encounter (HOSPITAL_COMMUNITY): Payer: Self-pay | Admitting: Urology

## 2020-08-15 DIAGNOSIS — N200 Calculus of kidney: Secondary | ICD-10-CM

## 2020-08-15 DIAGNOSIS — E669 Obesity, unspecified: Secondary | ICD-10-CM | POA: Insufficient documentation

## 2020-08-15 DIAGNOSIS — Z87442 Personal history of urinary calculi: Secondary | ICD-10-CM | POA: Diagnosis not present

## 2020-08-15 HISTORY — PX: EXTRACORPOREAL SHOCK WAVE LITHOTRIPSY: SHX1557

## 2020-08-15 SURGERY — LITHOTRIPSY, ESWL
Anesthesia: LOCAL | Laterality: Left

## 2020-08-15 MED ORDER — DIAZEPAM 5 MG PO TABS
10.0000 mg | ORAL_TABLET | Freq: Once | ORAL | Status: AC
  Administered 2020-08-15: 10 mg via ORAL

## 2020-08-15 MED ORDER — SODIUM CHLORIDE 0.9 % IV SOLN
Freq: Once | INTRAVENOUS | Status: AC
  Administered 2020-08-15: 1000 mL via INTRAVENOUS

## 2020-08-15 MED ORDER — DIPHENHYDRAMINE HCL 25 MG PO CAPS
ORAL_CAPSULE | ORAL | Status: AC
  Filled 2020-08-15: qty 1

## 2020-08-15 MED ORDER — DIPHENHYDRAMINE HCL 25 MG PO CAPS
25.0000 mg | ORAL_CAPSULE | ORAL | Status: AC
  Administered 2020-08-15: 25 mg via ORAL

## 2020-08-15 MED ORDER — TAMSULOSIN HCL 0.4 MG PO CAPS: 0.4000 mg | ORAL_CAPSULE | Freq: Every day | ORAL | 1 refills | Status: DC

## 2020-08-15 MED ORDER — DIAZEPAM 5 MG PO TABS
ORAL_TABLET | ORAL | Status: AC
  Filled 2020-08-15: qty 2

## 2020-08-15 MED ORDER — ONDANSETRON HCL 4 MG PO TABS: 4.0000 mg | ORAL_TABLET | Freq: Every day | ORAL | 1 refills | Status: DC | PRN

## 2020-08-15 MED ORDER — OXYCODONE-ACETAMINOPHEN 5-325 MG PO TABS: 1.0000 | ORAL_TABLET | ORAL | 0 refills | Status: DC | PRN

## 2020-08-15 NOTE — Interval H&P Note (Signed)
History and Physical Interval Note:  08/15/2020 8:13 AM  Dave Barker  has presented today for surgery, with the diagnosis of left renal calculus.  The various methods of treatment have been discussed with the patient and family. After consideration of risks, benefits and other options for treatment, the patient has consented to  Procedure(s): EXTRACORPOREAL SHOCK WAVE LITHOTRIPSY (ESWL) (Left) as a surgical intervention.  The patient's history has been reviewed, patient examined, no change in status, stable for surgery.  I have reviewed the patient's chart and labs.  Questions were answered to the patient's satisfaction.     Nicolette Bang

## 2020-08-17 ENCOUNTER — Encounter (HOSPITAL_COMMUNITY): Payer: Self-pay | Admitting: Urology

## 2020-09-06 ENCOUNTER — Encounter: Payer: Self-pay | Admitting: Urology

## 2020-09-06 ENCOUNTER — Ambulatory Visit (HOSPITAL_COMMUNITY)
Admission: RE | Admit: 2020-09-06 | Discharge: 2020-09-06 | Disposition: A | Discharge status: Home / Self Care | Payer: BC Managed Care – PPO | Source: Ambulatory Visit | Attending: Urology | Admitting: Urology

## 2020-09-06 ENCOUNTER — Ambulatory Visit (INDEPENDENT_AMBULATORY_CARE_PROVIDER_SITE_OTHER): Payer: BC Managed Care – PPO | Admitting: Urology

## 2020-09-06 ENCOUNTER — Other Ambulatory Visit: Payer: Self-pay

## 2020-09-06 VITALS — BP 162/89 | HR 80

## 2020-09-06 DIAGNOSIS — N200 Calculus of kidney: Secondary | ICD-10-CM

## 2020-09-06 LAB — URINALYSIS, ROUTINE W REFLEX MICROSCOPIC
Bilirubin, UA: NEGATIVE
Glucose, UA: NEGATIVE
Ketones, UA: NEGATIVE
Leukocytes,UA: NEGATIVE
Nitrite, UA: NEGATIVE
Protein,UA: NEGATIVE
Specific Gravity, UA: 1.025 (ref 1.005–1.030)
Urobilinogen, Ur: 0.2 mg/dL (ref 0.2–1.0)
pH, UA: 6 (ref 5.0–7.5)

## 2020-09-06 LAB — MICROSCOPIC EXAMINATION
Bacteria, UA: NONE SEEN
Epithelial Cells (non renal): NONE SEEN /hpf (ref 0–10)
Renal Epithel, UA: NONE SEEN /hpf
WBC, UA: NONE SEEN /hpf (ref 0–5)

## 2020-09-06 NOTE — Progress Notes (Signed)

## 2020-09-06 NOTE — Patient Instructions (Signed)
Textbook of Natural Medicine (5th ed., pp. 1518-1527.e3). [place], [place]: Elsevier.">  Dietary Guidelines to Help Prevent Kidney Stones Kidney stones are deposits of minerals and salts that form inside your kidneys. Your risk of developing kidney stones may be greater depending on your diet, your lifestyle, the medicines you take, and whether you have certain medical conditions. Most people can lower their chances of developing kidney stones by following the instructions below. Your dietitian may give you more specific instructions depending on your overall health and the type of kidney stones youtend to develop. What are tips for following this plan? Reading food labels  Choose foods with "no salt added" or "low-salt" labels. Limit your salt (sodium) intake to less than 1,500 mg a day. Choose foods with calcium for each meal and snack. Try to eat about 300 mg of calcium at each meal. Foods that contain 200-500 mg of calcium a serving include: 8 oz (237 mL) of milk, calcium-fortifiednon-dairy milk, and calcium-fortifiedfruit juice. Calcium-fortified means that calcium has been added to these drinks. 8 oz (237 mL) of kefir, yogurt, and soy yogurt. 4 oz (114 g) of tofu. 1 oz (28 g) of cheese. 1 cup (150 g) of dried figs. 1 cup (91 g) of cooked broccoli. One 3 oz (85 g) can of sardines or mackerel. Most people need 1,000-1,500 mg of calcium a day. Talk to your dietitian abouthow much calcium is recommended for you. Shopping Buy plenty of fresh fruits and vegetables. Most people do not need to avoid fruits and vegetables, even if these foods contain nutrients that may contribute to kidney stones. When shopping for convenience foods, choose: Whole pieces of fruit. Pre-made salads with dressing on the side. Low-fat fruit and yogurt smoothies. Avoid buying frozen meals or prepared deli foods. These can be high in sodium. Look for foods with live cultures, such as yogurt and kefir. Choose high-fiber  grains, such as whole-wheat breads, oat bran, and wheat cereals. Cooking Do not add salt to food when cooking. Place a salt shaker on the table and allow each person to add his or her own salt to taste. Use vegetable protein, such as beans, textured vegetable protein (TVP), or tofu, instead of meat in pasta, casseroles, and soups. Meal planning Eat less salt, if told by your dietitian. To do this: Avoid eating processed or pre-made food. Avoid eating fast food. Eat less animal protein, including cheese, meat, poultry, or fish, if told by your dietitian. To do this: Limit the number of times you have meat, poultry, fish, or cheese each week. Eat a diet free of meat at least 2 days a week. Eat only one serving each day of meat, poultry, fish, or seafood. When you prepare animal protein, cut pieces into small portion sizes. For most meat and fish, one serving is about the size of the palm of your hand. Eat at least five servings of fresh fruits and vegetables each day. To do this: Keep fruits and vegetables on hand for snacks. Eat one piece of fruit or a handful of berries with breakfast. Have a salad and fruit at lunch. Have two kinds of vegetables at dinner. Limit foods that are high in a substance called oxalate. These include: Spinach (cooked), rhubarb, beets, sweet potatoes, and Swiss chard. Peanuts. Potato chips, french fries, and baked potatoes with skin on. Nuts and nut products. Chocolate. If you regularly take a diuretic medicine, make sure to eat at least 1 or 2 servings of fruits or vegetables that are   high in potassium each day. These include: Avocado. Banana. Orange, prune, carrot, or tomato juice. Baked potato. Cabbage. Beans and split peas. Lifestyle  Drink enough fluid to keep your urine pale yellow. This is the most important thing you can do. Spread your fluid intake throughout the day. If you drink alcohol: Limit how much you use to: 0-1 drink a day for women who  are not pregnant. 0-2 drinks a day for men. Be aware of how much alcohol is in your drink. In the [place] one drink equals one 12 oz bottle of beer (355 mL), one 5 oz glass of wine (148 mL), or one 1 oz glass of hard liquor (44 mL). Lose weight if told by your health care provider. Work with your dietitian to find an eating plan and weight loss strategies that work best for you.  General information Talk to your health care provider and dietitian about taking daily supplements. You may be told the following depending on your health and the cause of your kidney stones: Not to take supplements with vitamin C. To take a calcium supplement. To take a daily probiotic supplement. To take other supplements such as magnesium, fish oil, or vitamin B6. Take over-the-counter and prescription medicines only as told by your health care provider. These include supplements. What foods should I limit? Limit your intake of the following foods, or eat them as told by your dietitian. Vegetables Spinach. Rhubarb. Beets. Canned vegetables. Pickles. Olives. Baked potatoeswith skin. Grains Wheat bran. Baked goods. Salted crackers. Cereals high in sugar. Meats and other proteins Nuts. Nut butters. Large portions of meat, poultry, or fish. Salted, precooked,or cured meats, such as sausages, meat loaves, and hot dogs. Dairy Cheese. Beverages Regular soft drinks. Regular vegetable juice. Seasonings and condiments Seasoning blends with salt. Salad dressings. Soy sauce. Ketchup. Barbecue sauce. Other foods Canned soups. Canned pasta sauce. Casseroles. Pizza. Lasagna. Frozen meals.Potato chips. French fries. The items listed above may not be a complete list of foods and beverages you should limit. Contact a dietitian for more information. What foods should I avoid? Talk to your dietitian about specific foods you should avoid based on the typeof kidney stones you have and your overall health. Fruits Grapefruit. The  item listed above may not be a complete list of foods and beverages you should avoid. Contact a dietitian for more information. Summary Kidney stones are deposits of minerals and salts that form inside your kidneys. You can lower your risk of kidney stones by making changes to your diet. The most important thing you can do is drink enough fluid. Drink enough fluid to keep your urine pale yellow. Talk to your dietitian about how much calcium you should have each day, and eat less salt and animal protein as told by your dietitian. This information is not intended to replace advice given to you by your health care provider. Make sure you discuss any questions you have with your healthcare provider. Document Revised: 01/14/2019 Document Reviewed: 01/14/2019 Elsevier Patient Education  2022 Elsevier Inc.  

## 2020-09-06 NOTE — Progress Notes (Signed)
09/06/2020 9:48 AM   Cathe Mons 05-Oct-1965 [id number]  Referring provider: Sharilyn Sites, MD [address] [place],  [place] [address]  Followup nephrolithiasis   HPI: Mr Mcgarey is a 55yo here for followup for nephrolithiasis. He underwent left ESWL 2 weeks ago. He passe numerous fragments. He has not passed any fragmentin in over 1 week. No flank pain. No LUTS. UA shows 3-10 RBCs   PMH: No past medical history on file.  Surgical History: Past Surgical History:  Procedure Laterality Date   EXTRACORPOREAL SHOCK WAVE LITHOTRIPSY Left 08/15/2020   Procedure: EXTRACORPOREAL SHOCK WAVE LITHOTRIPSY (ESWL);  Surgeon: Cleon Gustin, MD;  Location: [facility];  Service: Urology;  Laterality: Left;   LITHOTRIPSY Right     Home Medications:  Allergies as of 09/06/2020   No Known Allergies      Medication List        Accurate as of September 06, 2020  9:48 AM. If you have any questions, ask your nurse or doctor.          Magnesium 400 MG Tabs   omeprazole 40 MG capsule Commonly known as: PRILOSEC   ondansetron 4 MG tablet Commonly known as: Zofran Take 1 tablet (4 mg total) by mouth daily as needed for nausea or vomiting.   oxyCODONE-acetaminophen 5-325 MG tablet Commonly known as: Percocet Take 1 tablet by mouth every 4 (four) hours as needed for severe pain.   tamsulosin 0.4 MG Caps capsule Commonly known as: Flomax Take 1 capsule (0.4 mg total) by mouth daily after supper.        Allergies: No Known Allergies  Family History: No family history on file.  Social History:  reports that he does not drink alcohol and does not use drugs. No history on file for tobacco use.  ROS: All other review of systems were reviewed and are negative except what is noted above in HPI  Physical Exam: BP (!) 162/89   Pulse 80   Constitutional:  Alert and oriented, No acute distress. HEENT: [place] AT, moist mucus membranes.  Trachea midline, no masses. Cardiovascular:  No clubbing, cyanosis, or edema. Respiratory: Normal respiratory effort, no increased work of breathing. GI: Abdomen is soft, nontender, nondistended, no abdominal masses GU: No CVA tenderness.  Lymph: No cervical or inguinal lymphadenopathy. Skin: No rashes, bruises or suspicious lesions. Neurologic: Grossly intact, no focal deficits, moving all 4 extremities. Psychiatric: Normal mood and affect.  Laboratory Data: Lab Results  Component Value Date   WBC 7.4 08/20/2009   HGB 11.9 (L) 08/20/2009   HCT 35.3 (L) 08/20/2009   MCV 91.1 08/20/2009   PLT 284 08/20/2009    Lab Results  Component Value Date   CREATININE 0.96 08/20/2009    No results found for: PSA  No results found for: TESTOSTERONE  No results found for: HGBA1C  Urinalysis    Component Value Date/Time   COLORURINE YELLOW 11/17/2018 1258   APPEARANCEUR Clear 07/28/2020 1404   LABSPEC 1.018 11/17/2018 1258   PHURINE 6.0 11/17/2018 1258   GLUCOSEU Negative 07/28/2020 1404   HGBUR MODERATE (A) 11/17/2018 1258   BILIRUBINUR Negative 07/28/2020 1404   KETONESUR NEGATIVE 11/17/2018 1258   PROTEINUR Negative 07/28/2020 1404   PROTEINUR 30 (A) 11/17/2018 1258   UROBILINOGEN 0.2 08/20/2009 0001   NITRITE Negative 07/28/2020 1404   NITRITE NEGATIVE 11/17/2018 1258   LEUKOCYTESUR Negative 07/28/2020 1404   LEUKOCYTESUR NEGATIVE 11/17/2018 1258    Lab Results  Component Value Date   LABMICR  See below: 07/28/2020   WBCUA None seen 07/28/2020   LABEPIT 0-10 07/28/2020   MUCUS Present 07/28/2020   BACTERIA None seen 07/28/2020    Pertinent Imaging: KUB 09/06/2020: Images reviewed and discussed with the patient Results for orders placed during the hospital encounter of 08/15/20  DG Abd 1 View  Narrative CLINICAL DATA:  Preoperative evaluation for upcoming left-sided lithotripsy  EXAM: ABDOMEN - 1 VIEW  COMPARISON:  07/31/2020 CT  FINDINGS: Left renal calculus is again noted over the mid kidney stable  from prior CT. No ureteral stones are noted. Multiple phleboliths are seen within the pelvis. No acute bony abnormality is noted. Mild degenerative changes are seen.  IMPRESSION: Stable left renal calculus.   Electronically Signed By: Inez Catalina M.D. On: 08/17/2020 00:27  No results found for this or any previous visit.  No results found for this or any previous visit.  No results found for this or any previous visit.  Results for orders placed during the hospital encounter of 06/21/20  US RENAL  Narrative CLINICAL DATA:  Right flank pain.  EXAM: RENAL / URINARY TRACT ULTRASOUND COMPLETE  COMPARISON:  January 24, 2014.  FINDINGS: Right Kidney:  Renal measurements: 11.9 x 6.8 x 6.2 cm = volume: 263 mL. Multiple echogenic foci are noted concerning for nephrolithiasis, the largest measuring 13 mm. Echogenicity within normal limits. No mass or hydronephrosis visualized.  Left Kidney:  Renal measurements: 10.4 x 5.5 x 5.4 cm = volume: 163 mL. 14 mm nonobstructive calculus seen in lower pole collecting system. Echogenicity within normal limits. No mass or hydronephrosis visualized.  Bladder:  Appears normal for degree of bladder distention.  Other:  None.  IMPRESSION: Bilateral nonobstructive nephrolithiasis. No hydronephrosis or renal obstruction is noted.   Electronically Signed By: Marijo Conception M.D. On: 06/21/2020 12:28  No results found for this or any previous visit.  No results found for this or any previous visit.  Results for orders placed in visit on 07/28/20  CT RENAL STONE STUDY  Narrative CLINICAL DATA:  Hematuria, right-sided flank pain x1 0.5 months. History of renal stone.  EXAM: CT ABDOMEN AND PELVIS WITHOUT CONTRAST  TECHNIQUE: Multidetector CT imaging of the abdomen and pelvis was performed following the standard protocol without IV contrast.  COMPARISON:  Ultrasound Jun 21, 2020 and CT January 24, 2014  FINDINGS: Lower chest: No acute abnormality. Right lower lobe calcified granuloma.  Hepatobiliary: Small calcification in the right lobe of the liver on image 18/2, favored sequela prior infection/inflammation. Otherwise unremarkable noncontrast appearance of the hepatic parenchyma. Gallbladder is unremarkable. No biliary ductal dilation.  Pancreas: Within normal limits.  Spleen: Within normal limits.  Adrenals/Urinary Tract: Bilateral adrenal glands are unremarkable.  No hydronephrosis. No right-sided renal stones. Nonobstructive left renal calculi measuring up to 1 cm. No ureteral or bladder calculi visualized. No contour deforming renal masses. Hypodense 5 mm right upper pole renal lesion, incompletely evaluated secondary to size and lack of intravenous contrast material but statistically most likely represent cysts.  Urinary bladder is grossly unremarkable for degree of distension.  Stomach/Bowel: Stomach is within normal limits. Appendix appears normal. No evidence of bowel wall thickening, distention, or inflammatory changes. The colon is predominately decompressed limiting evaluation.  Vascular/Lymphatic: No abdominal aortic aneurysm. No pathologically enlarged abdominal or pelvic lymph nodes.  Reproductive: Mild prostatic enlargement.  Other: Small fat containing umbilical hernia. No abdominopelvic ascites.  Musculoskeletal: No acute or significant osseous findings.  IMPRESSION: 1. Nonobstructive left nephrolithiasis measuring up  to 1 cm. 2. No hydronephrosis. No right-sided renal stones visualized. No ureteral or bladder calculi visualized. 3. Small fat containing umbilical hernia.   Electronically Signed By: Dahlia Bailiff MD On: 07/31/2020 10:54   Assessment & Plan:    1. Kidney stones -RTC 6 weeks with KUB - Urinalysis, Routine w reflex microscopic - DG Abd 1 View; Future   No follow-ups on file.  Nicolette Bang, MD  [facility]  Urology [place]

## 2020-09-26 ENCOUNTER — Encounter (INDEPENDENT_AMBULATORY_CARE_PROVIDER_SITE_OTHER): Payer: Self-pay | Admitting: *Deleted

## 2020-10-25 ENCOUNTER — Ambulatory Visit: Payer: BC Managed Care – PPO | Admitting: Urology

## 2020-10-25 DIAGNOSIS — N200 Calculus of kidney: Secondary | ICD-10-CM

## 2021-02-01 ENCOUNTER — Other Ambulatory Visit: Payer: Self-pay

## 2021-02-01 ENCOUNTER — Ambulatory Visit (INDEPENDENT_AMBULATORY_CARE_PROVIDER_SITE_OTHER): Payer: BC Managed Care – PPO | Admitting: Gastroenterology

## 2021-02-01 ENCOUNTER — Encounter (INDEPENDENT_AMBULATORY_CARE_PROVIDER_SITE_OTHER): Payer: Self-pay | Admitting: Gastroenterology

## 2021-02-01 ENCOUNTER — Encounter (INDEPENDENT_AMBULATORY_CARE_PROVIDER_SITE_OTHER): Payer: Self-pay

## 2021-02-01 ENCOUNTER — Other Ambulatory Visit (INDEPENDENT_AMBULATORY_CARE_PROVIDER_SITE_OTHER): Payer: Self-pay

## 2021-02-01 ENCOUNTER — Telehealth (INDEPENDENT_AMBULATORY_CARE_PROVIDER_SITE_OTHER): Payer: Self-pay

## 2021-02-01 DIAGNOSIS — D649 Anemia, unspecified: Secondary | ICD-10-CM

## 2021-02-01 MED ORDER — PEG 3350-KCL-NA BICARB-NACL 420 G PO SOLR: 4000.0000 mL | ORAL | 0 refills | Status: DC

## 2021-02-01 NOTE — Progress Notes (Signed)
Dave Barker, M.D. Gastroenterology & Hepatology [facility] [address] [place], [place] [address] Primary Care Physician: Sharilyn Sites, MD [address] [place] [address]  Referring MD: PCP  Chief Complaint:  anemia  History of Present Illness: Dave Barker is a 55 y.o. male with HTN, kidney stones, HLD, who presents for evaluation of anemia.  Patient was referred to our office for evaluation of anemia.  Had blood testing performed on 09/14/2020 which showed a hemoglobin of 11.7 with MCV of 91, white cell count of 6.8, platelets of 339, CMP showed ALT of 35, AST of 23, alkaline phosphatase 73, total bilirubin 0.4, normal renal function and electrolytes.  Patient states he has not present any symptoms.  The patient denies having any nausea, vomiting, fever, chills, hematochezia, melena, hematemesis, abdominal distention, abdominal pain, diarrhea, jaundice, pruritus. Gained 7 lb in the last 2 months.  Denies intake of NSAIDs, anticoagulants, high dose aspirin, or any other antiplatelet.  Notably, the patient had scant amount of red blood cells in his urine which were attributed to renal stones.  Last AXE:NMMHW Last Colonoscopy:never  FHx: neg for any gastrointestinal/liver disease, mother had a GI cancer but unknown type Social: neg smoking, alcohol or illicit drug use Surgical: no abdominal surgeries  Past Medical History: Past Medical History:  Diagnosis Date   Hyperlipemia    Hypertension    Kidney stones     Past Surgical History: Past Surgical History:  Procedure Laterality Date   EXTRACORPOREAL SHOCK WAVE LITHOTRIPSY Left 08/15/2020   Procedure: EXTRACORPOREAL SHOCK WAVE LITHOTRIPSY (ESWL);  Surgeon: Cleon Gustin, MD;  Location: [facility];  Service: Urology;  Laterality: Left;   LITHOTRIPSY Right     Family History: Family History  Problem Relation Age of Onset   Stomach cancer Mother      Social History: Social History   Tobacco Use  Smoking Status Never  Smokeless Tobacco Never   Social History   Substance and Sexual Activity  Alcohol Use No   Social History   Substance and Sexual Activity  Drug Use No    Allergies: Allergies  Allergen Reactions   Other     Mucinex  - ithcing    Medications: Current Outpatient Medications  Medication Sig Dispense Refill   amLODipine (NORVASC) 5 MG tablet Take 5 mg by mouth daily.     rosuvastatin (CRESTOR) 10 MG tablet Take 10 mg by mouth daily.     No current facility-administered medications for this visit.    Review of Systems: GENERAL: negative for malaise, night sweats HEENT: No changes in hearing or vision, no nose bleeds or other nasal problems. NECK: Negative for lumps, goiter, pain and significant neck swelling RESPIRATORY: Negative for cough, wheezing CARDIOVASCULAR: Negative for chest pain, leg swelling, palpitations, orthopnea GI: SEE HPI MUSCULOSKELETAL: Negative for joint pain or swelling, back pain, and muscle pain. SKIN: Negative for lesions, rash PSYCH: Negative for sleep disturbance, mood disorder and recent psychosocial stressors. HEMATOLOGY Negative for prolonged bleeding, bruising easily, and swollen nodes. ENDOCRINE: Negative for cold or heat intolerance, polyuria, polydipsia and goiter. NEURO: negative for tremor, gait imbalance, syncope and seizures. The remainder of the review of systems is noncontributory.   Physical Exam: BP (!) 169/61 (BP Location: Left Arm, Patient Position: Sitting, Cuff Size: Large)    Pulse 82    Temp 98.5 F (36.9 C) (Oral)    Ht 6' (1.829 m)    Wt 295 lb (133.8 kg)  BMI 40.01 kg/m  GENERAL: The patient is AO x3, in no acute distress. HEENT: Head is normocephalic and atraumatic. EOMI are intact. Mouth is well hydrated and without lesions. NECK: Supple. No masses LUNGS: Clear to auscultation. No presence of rhonchi/wheezing/rales. Adequate chest  expansion HEART: RRR, normal s1 and s2. ABDOMEN: Soft, nontender, no guarding, no peritoneal signs, and nondistended. BS +. No masses. EXTREMITIES: Without any cyanosis, clubbing, rash, lesions or edema. NEUROLOGIC: AOx3, no focal motor deficit. SKIN: no jaundice, no rashes   Imaging/Labs: as above  I personally reviewed and interpreted the available labs, imaging and endoscopic files.  Impression and Plan: Dave Barker is a 55 y.o. male with HTN, kidney stones, HLD, who presents for evaluation of anemia.  The patient has not presented any overt gastrointestinal bleeding.  He has very mild anemia which is normocytic.  Unfortunately, no iron stores have been checked so far.  We will check a repeat CBC and evaluate his current iron stores.  If his iron stores are low, he will need to have an EGD.  For now, we will set up a colonoscopy as part of the evaluation of anemia as he has never had one.  - Schedule colonoscopy - Check CBC and iron stores - If low iron stores, we will need to schedule EGD as well  All questions were answered.      Dave Peppers, MD Gastroenterology and Hepatology [facility]

## 2021-02-01 NOTE — Telephone Encounter (Signed)
LeighAnn Alyss Granato, CMA  

## 2021-02-01 NOTE — Patient Instructions (Signed)
Schedule colonoscopy Perform blood workup If low iron stores, we will need to schedule EGD as well

## 2021-02-02 ENCOUNTER — Encounter (INDEPENDENT_AMBULATORY_CARE_PROVIDER_SITE_OTHER): Payer: Self-pay

## 2021-02-02 LAB — CBC WITH DIFFERENTIAL/PLATELET
Absolute Monocytes: 522 cells/uL (ref 200–950)
Basophils Absolute: 42 cells/uL (ref 0–200)
Basophils Relative: 0.7 %
Eosinophils Absolute: 102 cells/uL (ref 15–500)
Eosinophils Relative: 1.7 %
HCT: 37.5 % — ABNORMAL LOW (ref 38.5–50.0)
Hemoglobin: 12.1 g/dL — ABNORMAL LOW (ref 13.2–17.1)
Lymphs Abs: 2394 cells/uL (ref 850–3900)
MCH: 29.7 pg (ref 27.0–33.0)
MCHC: 32.3 g/dL (ref 32.0–36.0)
MCV: 91.9 fL (ref 80.0–100.0)
MPV: 10.1 fL (ref 7.5–12.5)
Monocytes Relative: 8.7 %
Neutro Abs: 2940 cells/uL (ref 1500–7800)
Neutrophils Relative %: 49 %
Platelets: 340 10*3/uL (ref 140–400)
RBC: 4.08 10*6/uL — ABNORMAL LOW (ref 4.20–5.80)
RDW: 11.8 % (ref 11.0–15.0)
Total Lymphocyte: 39.9 %
WBC: 6 10*3/uL (ref 3.8–10.8)

## 2021-02-02 LAB — IRON, TOTAL/TOTAL IRON BINDING CAP
%SAT: 28 % (calc) (ref 20–48)
Iron: 76 ug/dL (ref 50–180)
TIBC: 272 mcg/dL (calc) (ref 250–425)

## 2021-02-02 LAB — FERRITIN: Ferritin: 315 ng/mL (ref 38–380)

## 2021-03-14 ENCOUNTER — Encounter (HOSPITAL_COMMUNITY)
Admission: RE | Disposition: A | Discharge status: Home / Self Care | Payer: Self-pay | Source: Home / Self Care | Attending: Gastroenterology

## 2021-03-14 ENCOUNTER — Encounter (HOSPITAL_COMMUNITY): Payer: Self-pay | Admitting: Gastroenterology

## 2021-03-14 ENCOUNTER — Ambulatory Visit (HOSPITAL_COMMUNITY): Payer: BC Managed Care – PPO | Admitting: Anesthesiology

## 2021-03-14 ENCOUNTER — Ambulatory Visit (HOSPITAL_COMMUNITY)
Admission: RE | Admit: 2021-03-14 | Discharge: 2021-03-14 | Disposition: A | Discharge status: Home / Self Care | Payer: BC Managed Care – PPO | Attending: Gastroenterology | Admitting: Gastroenterology

## 2021-03-14 ENCOUNTER — Other Ambulatory Visit: Payer: Self-pay

## 2021-03-14 DIAGNOSIS — I10 Essential (primary) hypertension: Secondary | ICD-10-CM | POA: Insufficient documentation

## 2021-03-14 DIAGNOSIS — D120 Benign neoplasm of cecum: Secondary | ICD-10-CM | POA: Insufficient documentation

## 2021-03-14 DIAGNOSIS — K5730 Diverticulosis of large intestine without perforation or abscess without bleeding: Secondary | ICD-10-CM

## 2021-03-14 DIAGNOSIS — D649 Anemia, unspecified: Secondary | ICD-10-CM | POA: Diagnosis not present

## 2021-03-14 DIAGNOSIS — E785 Hyperlipidemia, unspecified: Secondary | ICD-10-CM | POA: Insufficient documentation

## 2021-03-14 DIAGNOSIS — Z6838 Body mass index (BMI) 38.0-38.9, adult: Secondary | ICD-10-CM | POA: Insufficient documentation

## 2021-03-14 DIAGNOSIS — K635 Polyp of colon: Secondary | ICD-10-CM | POA: Diagnosis not present

## 2021-03-14 HISTORY — PX: POLYPECTOMY: SHX5525

## 2021-03-14 HISTORY — PX: COLONOSCOPY WITH PROPOFOL: SHX5780

## 2021-03-14 LAB — HM COLONOSCOPY

## 2021-03-14 SURGERY — COLONOSCOPY WITH PROPOFOL
Anesthesia: General

## 2021-03-14 MED ORDER — PROPOFOL 10 MG/ML IV BOLUS
INTRAVENOUS | Status: DC | PRN
Start: 2021-03-14 — End: 2021-03-14
  Administered 2021-03-14: 100 mg via INTRAVENOUS

## 2021-03-14 MED ORDER — LACTATED RINGERS IV SOLN
INTRAVENOUS | Status: DC | PRN
Start: 2021-03-14 — End: 2021-03-14

## 2021-03-14 MED ORDER — PROPOFOL 500 MG/50ML IV EMUL
INTRAVENOUS | Status: DC | PRN
  Administered 2021-03-14: 150 ug/kg/min via INTRAVENOUS

## 2021-03-14 MED ORDER — LACTATED RINGERS IV SOLN: INTRAVENOUS | Status: DC

## 2021-03-14 MED ORDER — LIDOCAINE HCL (CARDIAC) PF 100 MG/5ML IV SOSY
PREFILLED_SYRINGE | INTRAVENOUS | Status: DC | PRN
  Administered 2021-03-14: 50 mg via INTRAVENOUS

## 2021-03-14 NOTE — Transfer of Care (Signed)
Immediate Anesthesia Transfer of Care Note  Patient: Dave Barker  Procedure(s) Performed: COLONOSCOPY WITH PROPOFOL POLYPECTOMY  Patient Location: Endoscopy Unit  Anesthesia Type:General  Level of Consciousness: drowsy  Airway & Oxygen Therapy: Patient Spontanous Breathing  Post-op Assessment: Report given to RN and Post -op Vital signs reviewed and stable  Post vital signs: Reviewed and stable  Last Vitals:  Vitals Value Taken Time  BP    Temp    Pulse    Resp    SpO2      Last Pain:  Vitals:   03/14/21 0905  TempSrc:   PainSc: 0-No pain      Patients Stated Pain Goal: 6 (35/68/61 6837)  Complications: No notable events documented.

## 2021-03-14 NOTE — Anesthesia Procedure Notes (Signed)
Date/Time: 03/14/2021 9:09 AM Performed by: Orlie Dakin, CRNA Pre-anesthesia Checklist: Patient identified, Emergency Drugs available, Suction available and Patient being monitored Patient Re-evaluated:Patient Re-evaluated prior to induction Oxygen Delivery Method: Nasal cannula Induction Type: IV induction Placement Confirmation: positive ETCO2

## 2021-03-14 NOTE — H&P (Signed)
Dave Barker is an 56 y.o. male.   Chief Complaint: anemia HPI: Dave Barker is a 56 y.o. male with HTN, kidney stones, HLD, who presents for evaluation of anemia via colonoscopy.  Patient has been asymptomatic. The patient denies having any nausea, vomiting, fever, chills, hematochezia, melena, hematemesis, abdominal distention, abdominal pain, diarrhea, jaundice, pruritus or weight loss. Normal iron stores recently.   Past Medical History:  Diagnosis Date   Hyperlipemia    Hypertension    Kidney stones     Past Surgical History:  Procedure Laterality Date   EXTRACORPOREAL SHOCK WAVE LITHOTRIPSY Left 08/15/2020   Procedure: EXTRACORPOREAL SHOCK WAVE LITHOTRIPSY (ESWL);  Surgeon: Cleon Gustin, MD;  Location: [facility];  Service: Urology;  Laterality: Left;   LITHOTRIPSY Right     Family History  Problem Relation Age of Onset   Stomach cancer Mother    Social History:  reports that he has never smoked. He has never used smokeless tobacco. He reports that he does not drink alcohol and does not use drugs.  Allergies:  Allergies  Allergen Reactions   Mucinex [Guaifenesin Er] Itching    Medications Prior to Admission  Medication Sig Dispense Refill   amLODipine (NORVASC) 5 MG tablet Take 5 mg by mouth daily.     polyethylene glycol-electrolytes (TRILYTE) 420 g solution Take 4,000 mLs by mouth as directed. 4000 mL 0   rosuvastatin (CRESTOR) 10 MG tablet Take 10 mg by mouth daily.      No results found for this or any previous visit (from the past 48 hour(s)). No results found.  Review of Systems  Constitutional: Negative.   HENT: Negative.    Eyes: Negative.   Respiratory: Negative.    Cardiovascular: Negative.   Gastrointestinal: Negative.   Endocrine: Negative.   Genitourinary: Negative.   Musculoskeletal: Negative.   Skin: Negative.   Allergic/Immunologic: Negative.   Neurological: Negative.   Hematological: Negative.   Psychiatric/Behavioral: Negative.      Blood pressure (!) 153/82, pulse 75, temperature 98.5 F (36.9 C), temperature source Oral, resp. rate 18, height 6' (1.829 m), weight 129.3 kg, SpO2 99 %. Physical Exam  GENERAL: The patient is AO x3, in no acute distress. HEENT: Head is normocephalic and atraumatic. EOMI are intact. Mouth is well hydrated and without lesions. NECK: Supple. No masses LUNGS: Clear to auscultation. No presence of rhonchi/wheezing/rales. Adequate chest expansion HEART: RRR, normal s1 and s2. ABDOMEN: Soft, nontender, no guarding, no peritoneal signs, and nondistended. BS +. No masses. EXTREMITIES: Without any cyanosis, clubbing, rash, lesions or edema. NEUROLOGIC: AOx3, no focal motor deficit. SKIN: no jaundice, no rashes  Assessment/Plan Dave Barker is a 56 y.o. male with HTN, kidney stones, HLD, who presents for evaluation of anemia via colonoscopy. Will proceed with colonoscopy  Harvel Quale, MD 03/14/2021, 7:33 AM

## 2021-03-14 NOTE — Anesthesia Preprocedure Evaluation (Signed)
Anesthesia Evaluation  Patient identified by MRN, date of birth, ID band Patient awake    Reviewed: Allergy & Precautions, H&P , NPO status , Patient's Chart, lab work & pertinent test results, reviewed documented beta blocker date and time   Airway Mallampati: II  TM Distance: >3 FB Neck ROM: full    Dental no notable dental hx.    Pulmonary neg pulmonary ROS,    Pulmonary exam normal breath sounds clear to auscultation       Cardiovascular Exercise Tolerance: Good hypertension, negative cardio ROS   Rhythm:regular Rate:Normal     Neuro/Psych negative neurological ROS  negative psych ROS   GI/Hepatic negative GI ROS, Neg liver ROS,   Endo/Other  Morbid obesity  Renal/GU Renal disease  negative genitourinary   Musculoskeletal   Abdominal   Peds  Hematology  (+) Blood dyscrasia, anemia ,   Anesthesia Other Findings   Reproductive/Obstetrics negative OB ROS                             Anesthesia Physical Anesthesia Plan  ASA: 3  Anesthesia Plan: General   Post-op Pain Management:    Induction:   PONV Risk Score and Plan: Propofol infusion  Airway Management Planned:   Additional Equipment:   Intra-op Plan:   Post-operative Plan:   Informed Consent: I have reviewed the patients History and Physical, chart, labs and discussed the procedure including the risks, benefits and alternatives for the proposed anesthesia with the patient or authorized representative who has indicated his/her understanding and acceptance.     Dental Advisory Given  Plan Discussed with: CRNA  Anesthesia Plan Comments:         Anesthesia Quick Evaluation

## 2021-03-14 NOTE — Op Note (Signed)
[facility] Patient Name: Dave Barker Procedure Date: 03/14/2021 8:56 AM MRN: [id number] Date of Birth: Sep 20, 1965 Attending MD: Maylon Peppers ,  CSN: [id number] Age: 56 Admit Type: Outpatient Procedure:                Colonoscopy Indications:              Anemia Providers:                Maylon Peppers, Caprice Kluver, Raphael Gibney,                            Technician Referring MD:              Medicines:                Monitored Anesthesia Care Complications:            No immediate complications. Estimated Blood Loss:     Estimated blood loss: none. Procedure:                Pre-Anesthesia Assessment:                           - Prior to the procedure, a History and Physical                            was performed, and patient medications, allergies                            and sensitivities were reviewed. The patient's                            tolerance of previous anesthesia was reviewed.                           - The risks and benefits of the procedure and the                            sedation options and risks were discussed with the                            patient. All questions were answered and informed                            consent was obtained.                           - ASA Grade Assessment: III - A patient with severe                            systemic disease.                           After obtaining informed consent, the colonoscope                            was passed under direct vision. Throughout the  procedure, the patient's blood pressure, pulse, and                            oxygen saturations were monitored continuously. The                            [id number]-[id number] ([id number]) scope was introduced through                            the anus and advanced to the the cecum, identified                            by appendiceal orifice and ileocecal valve. The                            colonoscopy was  performed with difficulty due to                            significant looping. Successful completion of the                            procedure was aided by applying abdominal pressure                            in epigastric area and putting the patient in his                            back. Scope In: 9:09:07 AM Scope Out: 9:49:40 AM Scope Withdrawal Time: 0 hours 18 minutes 28 seconds  Total Procedure Duration: 0 hours 40 minutes 33 seconds  Findings:      The perianal and digital rectal examinations were normal.      A 2 mm polyp was found in the cecum. The polyp was sessile. The polyp       was removed with a cold biopsy forceps. Resection and retrieval were       complete.      The transverse colon and ascending colon revealed significantly       excessive looping. Successful completion of the procedure was aided by       applying abdominal pressure in epigastric area and putting the patient       in his back.      A few large-mouthed diverticula were found in the transverse colon.      The retroflexed view of the distal rectum and anal verge was normal and       showed no anal or rectal abnormalities. Impression:               - One 2 mm polyp in the cecum, removed with a cold                            biopsy forceps. Resected and retrieved.                           - There was significant looping of the colon.                           -  Diverticulosis in the transverse colon.                           - The distal rectum and anal verge are normal on                            retroflexion view. Moderate Sedation:      Per Anesthesia Care Recommendation:           - Discharge patient to home (ambulatory).                           - Resume previous diet.                           - Await pathology results.                           - Repeat colonoscopy in 10 years for screening                            purposes. Procedure Code(s):        --- Professional ---                            [phone], Colonoscopy, flexible; with biopsy, single                            or multiple Diagnosis Code(s):        --- Professional ---                           K63.5, Polyp of colon                           D64.9, Anemia, unspecified                           K57.30, Diverticulosis of large intestine without                            perforation or abscess without bleeding CPT copyright 2019 [employer]. All rights reserved. The codes documented in this report are preliminary and upon coder review may  be revised to meet current compliance requirements. Maylon Peppers, MD Maylon Peppers,  03/14/2021 9:54:46 AM This report has been signed electronically. Number of Addenda: 0

## 2021-03-14 NOTE — Discharge Instructions (Signed)
You are being discharged to home.  Resume your previous diet.  We are waiting for your pathology results.  Your physician has recommended a repeat colonoscopy in 10 years for screening purposes.  

## 2021-03-15 ENCOUNTER — Encounter (INDEPENDENT_AMBULATORY_CARE_PROVIDER_SITE_OTHER): Payer: Self-pay | Admitting: *Deleted

## 2021-03-15 NOTE — Anesthesia Postprocedure Evaluation (Signed)
Anesthesia Post Note  Patient: Dave Barker  Procedure(s) Performed: COLONOSCOPY WITH PROPOFOL POLYPECTOMY  Patient location during evaluation: Phase II Anesthesia Type: General Level of consciousness: awake Pain management: pain level controlled Vital Signs Assessment: post-procedure vital signs reviewed and stable Respiratory status: spontaneous breathing and respiratory function stable Cardiovascular status: blood pressure returned to baseline and stable Postop Assessment: no headache and no apparent nausea or vomiting Anesthetic complications: no Comments: Late entry   No notable events documented.   Last Vitals:  Vitals:   03/14/21 0704 03/14/21 0953  BP: (!) 153/82 100/65  Pulse: 75 61  Resp: 18 (!) 22  Temp: 36.9 C 36.5 C  SpO2: 99% 95%    Last Pain:  Vitals:   03/14/21 0953  TempSrc: Axillary  PainSc: 0-No pain                 Louann Sjogren

## 2021-03-16 ENCOUNTER — Encounter (HOSPITAL_COMMUNITY): Payer: Self-pay | Admitting: Gastroenterology

## 2021-03-16 LAB — SURGICAL PATHOLOGY

## 2022-01-09 IMAGING — CT CT RENAL STONE PROTOCOL
2 of 4 series · 16 of 46 positions shown, 18 images · non-contrast
Comparison: Ultrasound June 21, 2020 and CT January 24, 2014

CLINICAL DATA: Hematuria, right-sided flank pain x1 0.5 months.
History of renal stone.

EXAM:
CT ABDOMEN AND PELVIS WITHOUT CONTRAST
TECHNIQUE: Multidetector CT imaging of the abdomen and pelvis was performed
following the standard protocol without IV contrast.

[Series 2: axial st · axial · 0.98mm/px · z∈[-661,-216]mm · 13 of 101 slices shown, 15 images]
[im 6/101  soft-tissue]
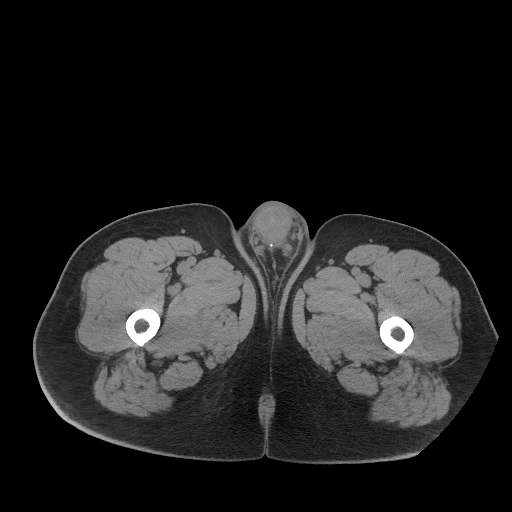
[im 6/101  bone]
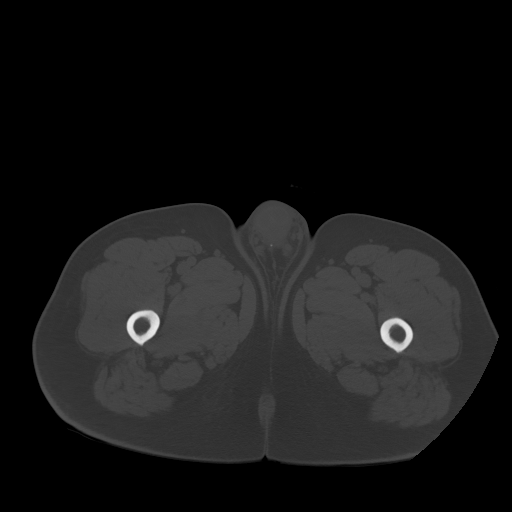
[im 16/101  soft-tissue]
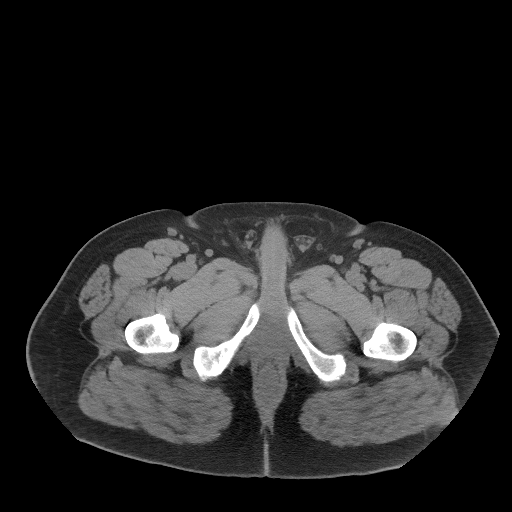
[im 22/101  soft-tissue]
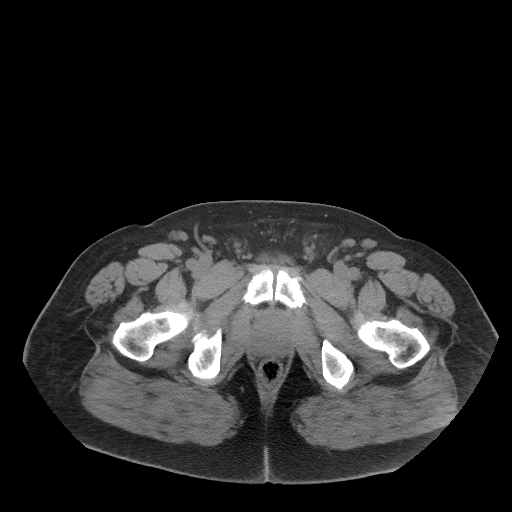
[im 27/101  soft-tissue]
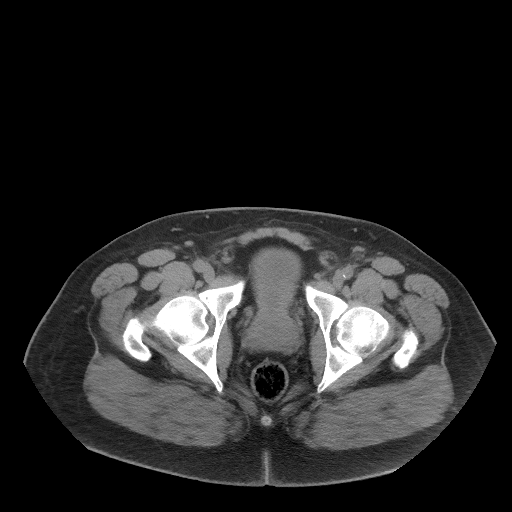
[im 37/101  soft-tissue]
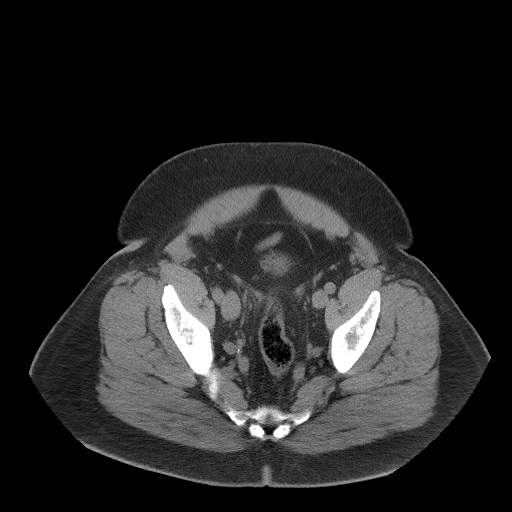
[im 43/101  soft-tissue]
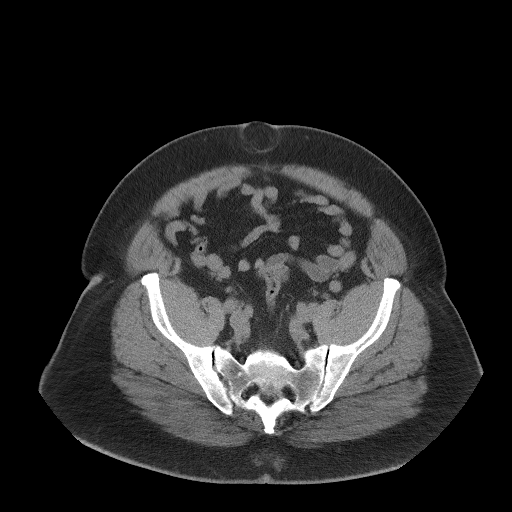
[im 53/101  soft-tissue]
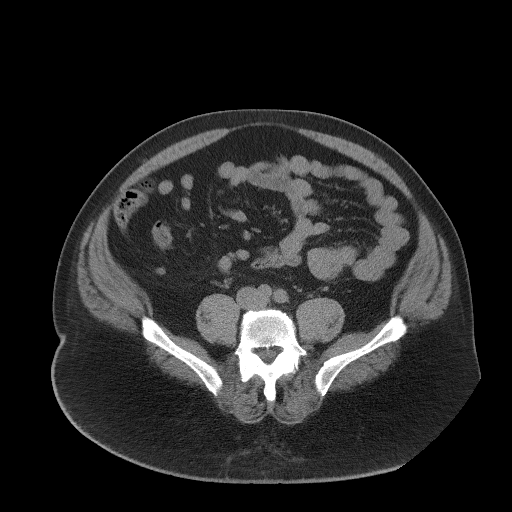
[im 58/101  soft-tissue]
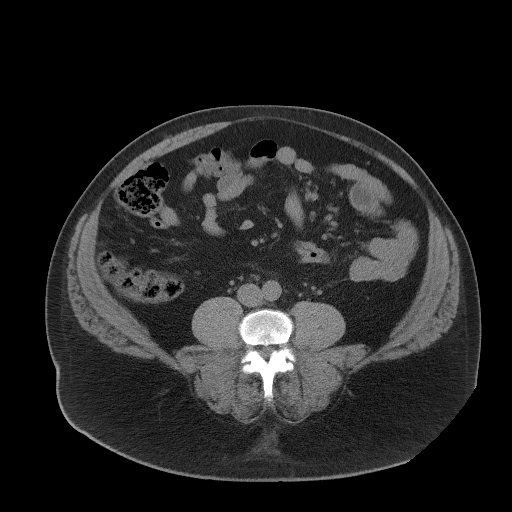
[im 64/101  soft-tissue]
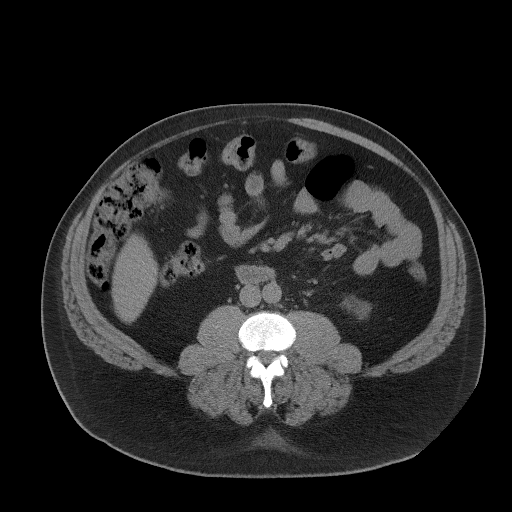
[im 64/101  bone]
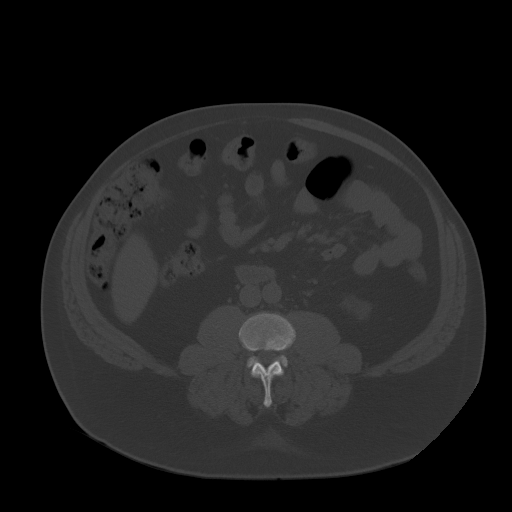
[im 74/101  soft-tissue]
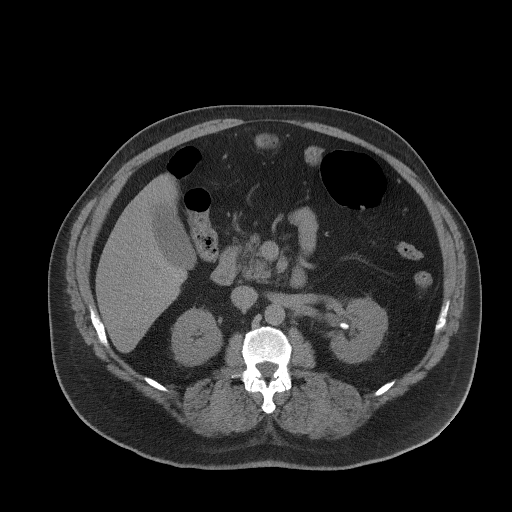
[im 79/101  soft-tissue]
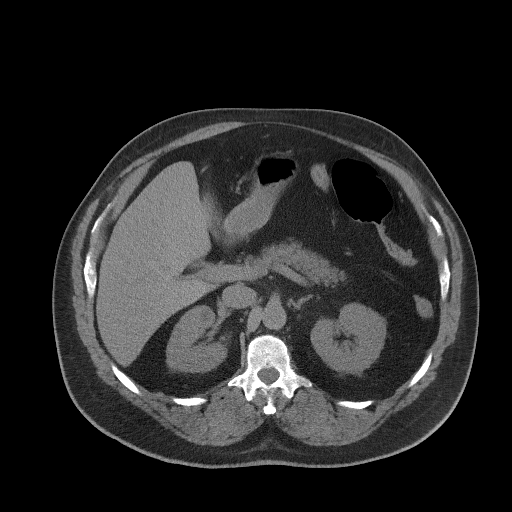
[im 85/101  soft-tissue]
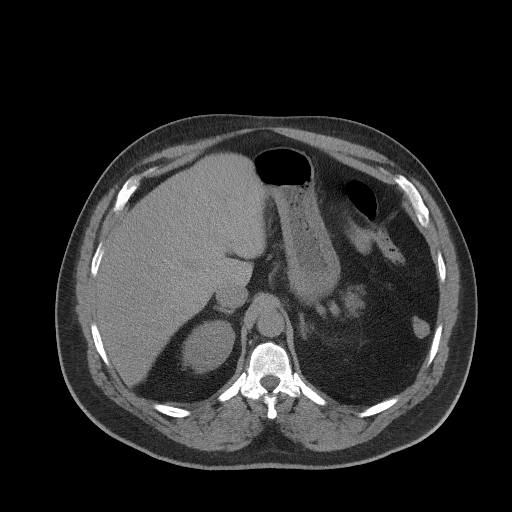
[im 95/101  soft-tissue]
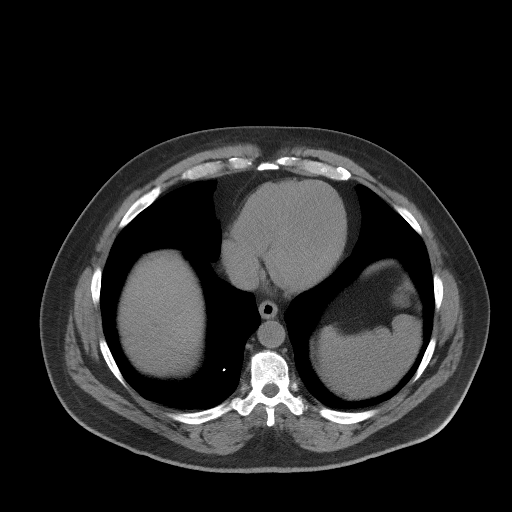

[Series 5: coronal st · coronal · 0.91mm/px · 3 of 131 slices shown]
[im 44/131  soft-tissue]
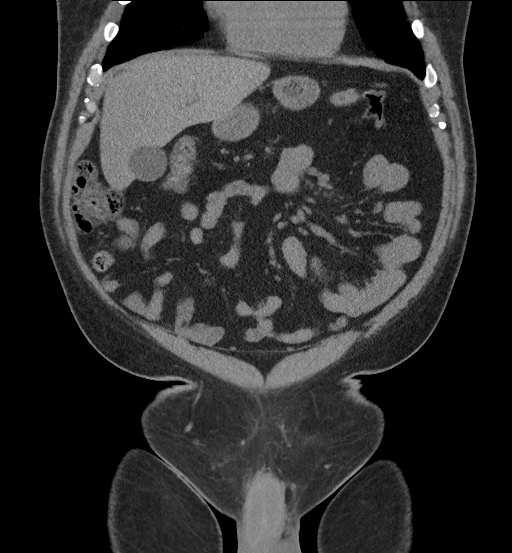
[im 58/131  soft-tissue]
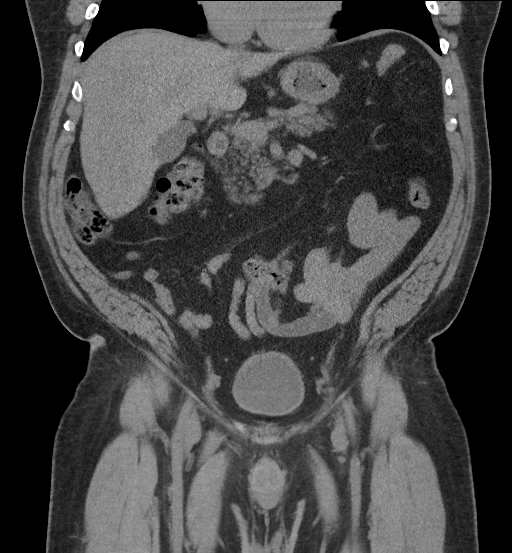
[im 73/131  soft-tissue]
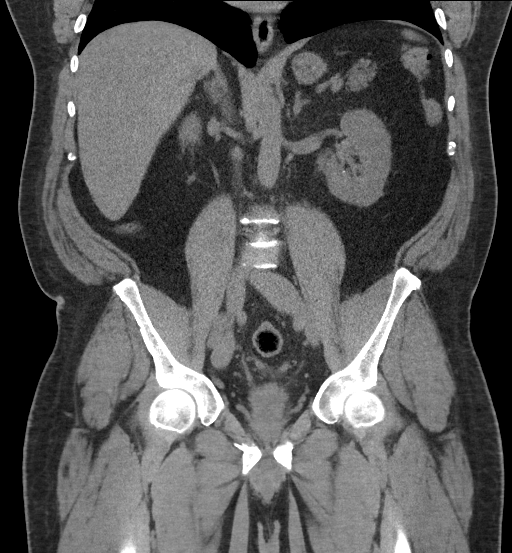

[16 of 46 positions shown; findings below may reference images not displayed]

FINDINGS: Lower chest: No acute abnormality. Right lower lobe calcified
granuloma.

Hepatobiliary: Small calcification in the right lobe of the liver on
image [DATE], favored sequela prior infection/inflammation. Otherwise
unremarkable noncontrast appearance of the hepatic parenchyma.
Gallbladder is unremarkable. No biliary ductal dilation.

Pancreas: Within normal limits.

Spleen: Within normal limits.

Adrenals/Urinary Tract: Bilateral adrenal glands are unremarkable.

No hydronephrosis. No right-sided renal stones. Nonobstructive left
renal calculi measuring up to 1 cm. No ureteral or bladder calculi
visualized. No contour deforming renal masses. Hypodense 5 mm right
upper pole renal lesion, incompletely evaluated secondary to size
and lack of intravenous contrast material but statistically most
likely represent cysts.

Urinary bladder is grossly unremarkable for degree of distension.

Stomach/Bowel: Stomach is within normal limits. Appendix appears
normal. No evidence of bowel wall thickening, distention, or
inflammatory changes. The colon is predominately decompressed
limiting evaluation.

Vascular/Lymphatic: No abdominal aortic aneurysm. No pathologically
enlarged abdominal or pelvic lymph nodes.

Reproductive: Mild prostatic enlargement.

Other: Small fat containing umbilical hernia. No abdominopelvic
ascites.

Musculoskeletal: No acute or significant osseous findings.
IMPRESSION: 1. Nonobstructive left nephrolithiasis measuring up to 1 cm.
2. No hydronephrosis. No right-sided renal stones visualized. No
ureteral or bladder calculi visualized.
3. Small fat containing umbilical hernia.

## 2022-01-24 IMAGING — DX DG ABDOMEN 1V
2 series · 2 of 2 positions shown · non-contrast
Comparison: 07/31/2020 CT

CLINICAL DATA: Preoperative evaluation for upcoming left-sided
lithotripsy

EXAM:
ABDOMEN - 1 VIEW

[abdomen kub (1 of 2)]
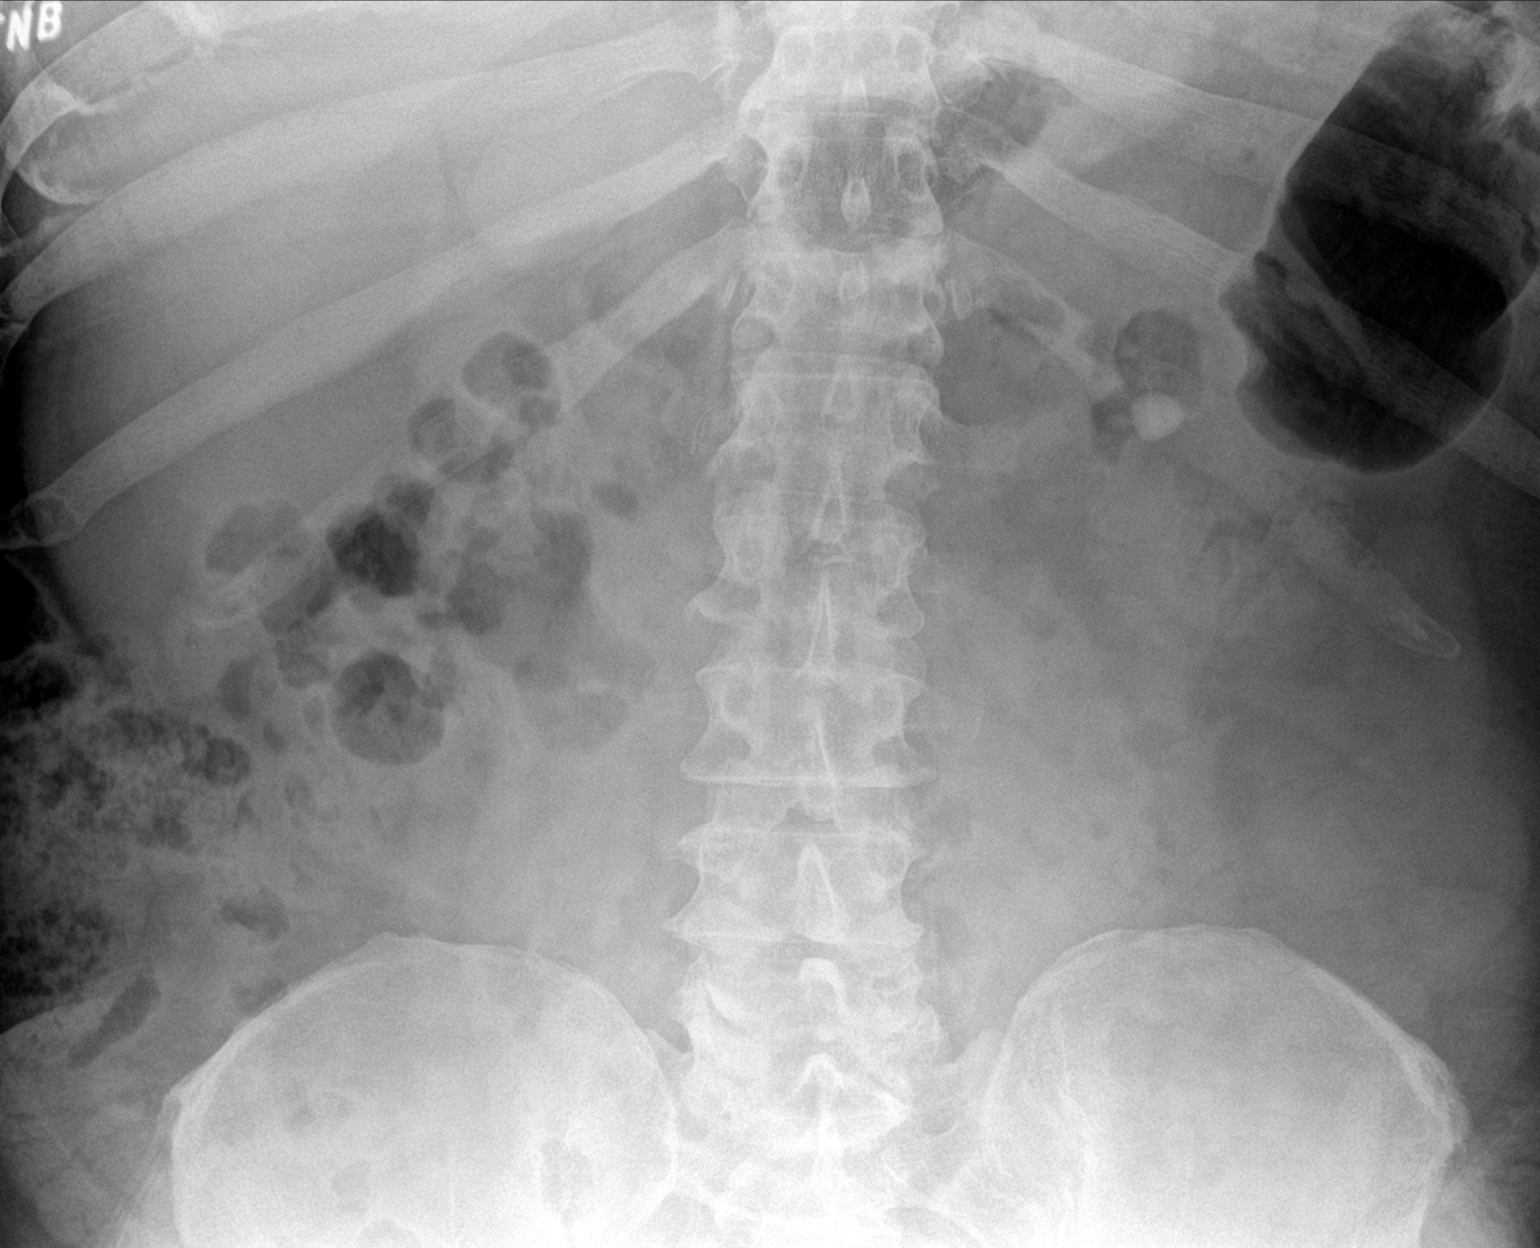

[abdomen kub (2 of 2)]
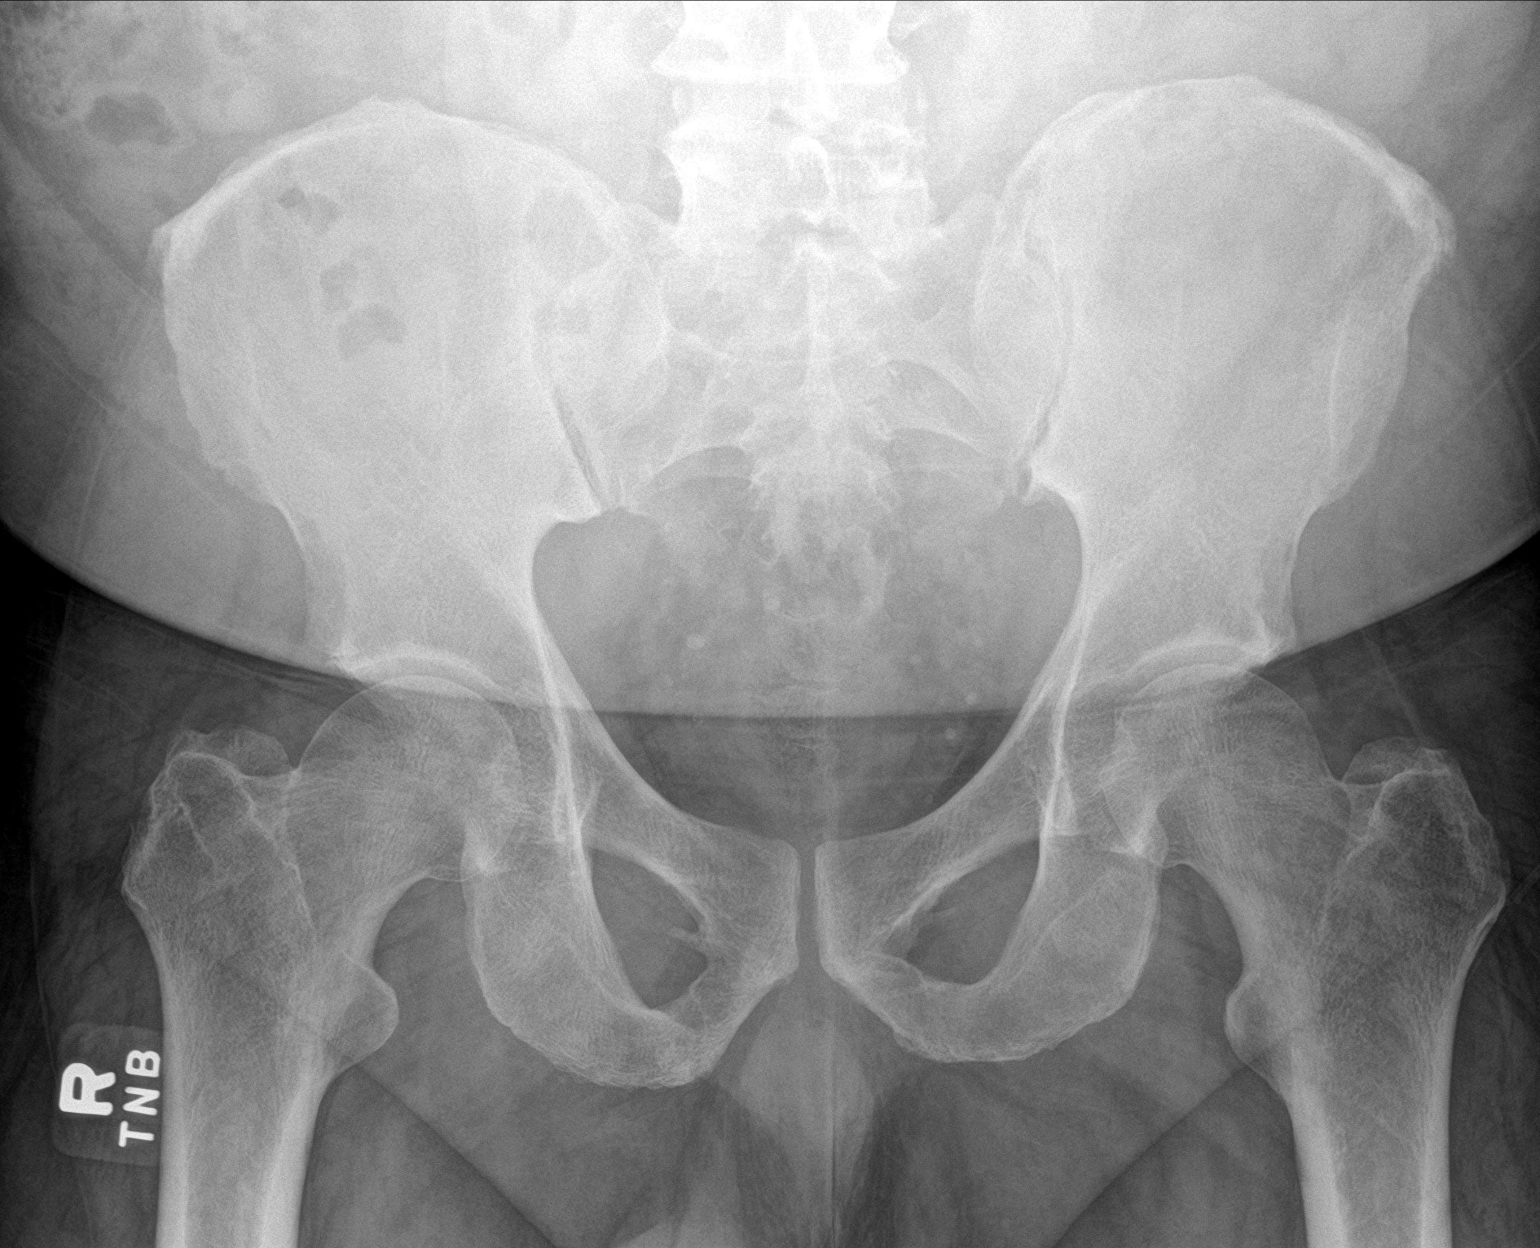

[2 of 2 positions shown; findings below may reference images not displayed]

FINDINGS: Left renal calculus is again noted over the mid kidney stable from
prior CT. No ureteral stones are noted. Multiple phleboliths are
seen within the pelvis. No acute bony abnormality is noted. Mild
degenerative changes are seen.
IMPRESSION: Stable left renal calculus.

## 2022-02-06 ENCOUNTER — Ambulatory Visit (INDEPENDENT_AMBULATORY_CARE_PROVIDER_SITE_OTHER): Payer: BC Managed Care – PPO

## 2022-02-06 ENCOUNTER — Encounter: Payer: Self-pay | Admitting: Orthopedic Surgery

## 2022-02-06 ENCOUNTER — Ambulatory Visit (INDEPENDENT_AMBULATORY_CARE_PROVIDER_SITE_OTHER): Payer: BC Managed Care – PPO | Admitting: Orthopedic Surgery

## 2022-02-06 VITALS — Ht 72.0 in | Wt 285.0 lb

## 2022-02-06 DIAGNOSIS — M25561 Pain in right knee: Secondary | ICD-10-CM

## 2022-02-07 ENCOUNTER — Encounter: Payer: Self-pay | Admitting: Orthopedic Surgery

## 2022-02-07 DIAGNOSIS — M25561 Pain in right knee: Secondary | ICD-10-CM | POA: Diagnosis not present

## 2022-02-07 MED ORDER — METHYLPREDNISOLONE ACETATE 40 MG/ML IJ SUSP
40.0000 mg | Freq: Once | INTRAMUSCULAR | Status: AC
  Administered 2022-02-07: 40 mg via INTRA_ARTICULAR

## 2022-02-07 NOTE — Addendum Note (Signed)
Addended by: Elizabeth Sauer on: 02/07/2022 10:34 AM   Modules accepted: Orders

## 2022-02-07 NOTE — Progress Notes (Signed)
New Patient Visit  Assessment: Dave Barker is a 57 y.o. male with the following: 1. Acute pain of right knee  Plan: Dave Barker has pain in the right knee.  Pain is in the posterior and medial aspect.  No specific injury.  He is a Dealer, and does a lot of crouching, kneeling and other type activities.  It is possible that he has stressed the meniscus and caused an injury.  However, he denies a history of swelling in the right knee.  We discussed multiple treatment options, and he elected to proceed with an injection.  This was completed in clinic today without issues.  He will follow-up as needed.  Procedure note injection Right knee joint   Verbal consent was obtained to inject the right knee joint  Timeout was completed to confirm the site of injection.  The skin was prepped with alcohol and ethyl chloride was sprayed at the injection site.  A 21-gauge needle was used to inject 40 mg of Depo-Medrol and 1% lidocaine (3 cc) into the right knee using an anterolateral approach.  There were no complications. A sterile bandage was applied.   Follow-up: Return if symptoms worsen or fail to improve.  Subjective:  Chief Complaint  Patient presents with   Knee Pain    R knee pain for 31mo No known injury    History of Present Illness: Dave MANNANis a 57y.o. male who presents for evaluation of right knee pain.  He states has had pain in the right knee for the past month.  No specific injury.  He localizes the pain to the posterior medial aspect of his knee.  He denies a history of swelling.  He works as a mDealer  He does a lot of kneeling, crawling and bending through his knees.  Medications have not been helpful.  No prior injection.  No prior history of injury to his right knee.   Review of Systems: No fevers or chills No numbness or tingling No chest pain No shortness of breath No bowel or bladder dysfunction No GI distress No headaches   Medical  History:  Past Medical History:  Diagnosis Date   Hyperlipemia    Hypertension    Kidney stones     Past Surgical History:  Procedure Laterality Date   COLONOSCOPY WITH PROPOFOL N/A 03/14/2021   Procedure: COLONOSCOPY WITH PROPOFOL;  Surgeon: CHarvel Quale MD;  Location: [facility];  Service: Gastroenterology;  Laterality: N/A;  815   EXTRACORPOREAL SHOCK WAVE LITHOTRIPSY Left 08/15/2020   Procedure: EXTRACORPOREAL SHOCK WAVE LITHOTRIPSY (ESWL);  Surgeon: MCleon Gustin MD;  Location: [facility];  Service: Urology;  Laterality: Left;   LITHOTRIPSY Right    POLYPECTOMY  03/14/2021   Procedure: POLYPECTOMY;  Surgeon: CMontez Morita DQuillian Quince MD;  Location: [facility];  Service: Gastroenterology;;    Family History  Problem Relation Age of Onset   Stomach cancer Mother    Social History   Tobacco Use   Smoking status: Never   Smokeless tobacco: Never  Vaping Use   Vaping Use: Never used  Substance Use Topics   Alcohol use: No   Drug use: No    Allergies  Allergen Reactions   Mucinex [Guaifenesin Er] Itching    No outpatient medications have been marked as taking for the 02/06/22 encounter (Office Visit) with CMordecai Rasmussen MD.    Objective: Ht 6' (1.829 m)   Wt 285 lb (129.3 kg)   BMI  38.65 kg/m   Physical Exam:  General: Alert and oriented. and No acute distress. Gait: Right sided antalgic gait.  Evaluation of the right knee demonstrates no swelling.  No bruising is appreciated.  He has tenderness to palpation along the posterior medial aspect of the right knee, along the joint line.  Mild discomfort with hyperflexion.  He also has pain with full extension.  Negative Lachman.  No increased laxity varus or valgus stress.  IMAGING: I personally ordered and reviewed the following images  X-rays of the right knee were obtained in clinic today.  No acute injuries are noted.  Neutral overall alignment.  Mild loss of joint space within the medial  compartment.  Small osteophytes are appreciated.  No bony lesions.  Impression: Right knee x-ray with mild degenerative changes.   New Medications:  No orders of the defined types were placed in this encounter.     Mordecai Rasmussen, MD  02/07/2022 7:59 AM

## 2022-04-09 ENCOUNTER — Ambulatory Visit: Payer: BC Managed Care – PPO | Admitting: Orthopedic Surgery

## 2022-04-09 ENCOUNTER — Encounter: Payer: Self-pay | Admitting: Orthopedic Surgery

## 2022-04-09 VITALS — BP 162/94 | HR 74 | Ht 72.0 in | Wt 277.0 lb

## 2022-04-09 DIAGNOSIS — G8929 Other chronic pain: Secondary | ICD-10-CM

## 2022-04-09 DIAGNOSIS — M25561 Pain in right knee: Secondary | ICD-10-CM | POA: Diagnosis not present

## 2022-04-09 MED ORDER — CELECOXIB 100 MG PO CAPS: 100.0000 mg | ORAL_CAPSULE | Freq: Two times a day (BID) | ORAL | 0 refills | Status: AC

## 2022-04-09 NOTE — Progress Notes (Signed)
Return patient Visit  Assessment: Dave Barker is a 57 y.o. male with the following: 1. Acute pain of right knee; concern for meniscal pathology  Plan: Dave Barker continues to have pain in the right knee.  Pain is primarily within the medial aspect of the right knee.  He does note occasional catching sensations.  He also has some buckling.  Prior injection was very helpful, taking 100% of his pain, but did not last for more than 2-3 weeks.  He will continue with Celebrex, he was fitted for a brace, and due to the concern for meniscus injury, I am recommending an MRI.  Once the MRI is complete, he will return to clinic to discuss the findings.  Follow-up: Return for After MRI.  Subjective:  Chief Complaint  Patient presents with   Knee Pain    Right     History of Present Illness: Dave Barker is a 57 y.o. male who returns for evaluation of right knee pain.  I saw him in clinic approximately 2 months ago, at which time I injected his right knee.  He had immediate improvement in his symptoms.  He reported 100% improvement of his symptoms overall, for 2-3 weeks.  However, the pain is progressively returned.  He has been taking Celebrex, but stopped taking this about a week ago as his prescription ran out.  He does not use a brace.  He reports pain in the medial aspect of the knee.  Occasional buckling sensations.  He notes some pinching in the medial aspect of the knee.   Review of Systems: No fevers or chills No numbness or tingling No chest pain No shortness of breath No bowel or bladder dysfunction No GI distress No headaches    Objective: BP (!) 162/94   Pulse 74   Ht 6' (1.829 m)   Wt 277 lb (125.6 kg)   BMI 37.57 kg/m   Physical Exam:  General: Alert and oriented. and No acute distress. Gait: Right sided antalgic gait.  Evaluation of the right knee demonstrates no swelling.  No bruising is appreciated.  He has tenderness to palpation along the posterior  medial aspect of the right knee, along the joint line.  Mild discomfort with hyperflexion.  He also has pain with full extension.  Negative Lachman.  No increased laxity varus or valgus stress.  Positive McMurray's  IMAGING: I personally ordered and reviewed the following images  No new imaging obtained today.   New Medications:  No orders of the defined types were placed in this encounter.     Mordecai Rasmussen, MD  04/09/2022 11:06 AM

## 2022-04-19 ENCOUNTER — Telehealth: Payer: Self-pay | Admitting: Orthopedic Surgery

## 2022-04-19 NOTE — Telephone Encounter (Signed)
Returned the patient's call, lvm for the patient to call us back. 

## 2022-05-02 ENCOUNTER — Ambulatory Visit (HOSPITAL_COMMUNITY): Payer: BC Managed Care – PPO
